# Patient Record
Sex: Female | Born: 1975 | Race: White | Hispanic: No | Marital: Married | Smoking: Former smoker
Health system: Southern US, Community
[De-identification: ages and names within clinical notes are randomized; demographics above are authoritative.]

## PROBLEM LIST (undated history)

## (undated) DIAGNOSIS — R569 Unspecified convulsions: Secondary | ICD-10-CM

## (undated) DIAGNOSIS — I10 Essential (primary) hypertension: Secondary | ICD-10-CM

## (undated) DIAGNOSIS — R002 Palpitations: Secondary | ICD-10-CM

## (undated) DIAGNOSIS — F4310 Post-traumatic stress disorder, unspecified: Secondary | ICD-10-CM

## (undated) DIAGNOSIS — F329 Major depressive disorder, single episode, unspecified: Secondary | ICD-10-CM

## (undated) DIAGNOSIS — F32A Depression, unspecified: Secondary | ICD-10-CM

## (undated) HISTORY — PX: INDUCED ABORTION: SHX677

## (undated) HISTORY — PX: FACIAL COSMETIC SURGERY: SHX629

## (undated) HISTORY — PX: DILATION AND CURETTAGE OF UTERUS: SHX78

---

## 1998-05-12 ENCOUNTER — Emergency Department (HOSPITAL_COMMUNITY): Admission: EM | Admit: 1998-05-12 | Discharge: 1998-05-12 | Payer: Self-pay | Admitting: Emergency Medicine

## 1999-02-05 ENCOUNTER — Inpatient Hospital Stay (HOSPITAL_COMMUNITY): Admission: AD | Admit: 1999-02-05 | Discharge: 1999-02-05 | Payer: Self-pay | Admitting: Family Medicine

## 1999-02-05 ENCOUNTER — Encounter: Payer: Self-pay | Admitting: Obstetrics

## 1999-02-12 ENCOUNTER — Inpatient Hospital Stay (HOSPITAL_COMMUNITY): Admission: RE | Admit: 1999-02-12 | Discharge: 1999-02-12 | Payer: Self-pay | Admitting: Family Medicine

## 1999-02-14 ENCOUNTER — Ambulatory Visit (HOSPITAL_COMMUNITY): Admission: RE | Admit: 1999-02-14 | Discharge: 1999-02-14 | Payer: Self-pay | Admitting: Obstetrics

## 2001-01-25 ENCOUNTER — Encounter: Payer: Self-pay | Admitting: Emergency Medicine

## 2001-01-25 ENCOUNTER — Emergency Department (HOSPITAL_COMMUNITY): Admission: EM | Admit: 2001-01-25 | Discharge: 2001-01-25 | Payer: Self-pay | Admitting: Emergency Medicine

## 2001-03-22 ENCOUNTER — Emergency Department (HOSPITAL_COMMUNITY): Admission: EM | Admit: 2001-03-22 | Discharge: 2001-03-22 | Payer: Self-pay | Admitting: *Deleted

## 2001-12-26 ENCOUNTER — Emergency Department (HOSPITAL_COMMUNITY): Admission: EM | Admit: 2001-12-26 | Discharge: 2001-12-26 | Payer: Self-pay | Admitting: Emergency Medicine

## 2002-12-24 ENCOUNTER — Inpatient Hospital Stay (HOSPITAL_COMMUNITY): Admission: EM | Admit: 2002-12-24 | Discharge: 2002-12-25 | Payer: Self-pay | Admitting: Emergency Medicine

## 2002-12-24 ENCOUNTER — Encounter: Payer: Self-pay | Admitting: Emergency Medicine

## 2003-08-14 ENCOUNTER — Emergency Department (HOSPITAL_COMMUNITY): Admission: EM | Admit: 2003-08-14 | Discharge: 2003-08-15 | Payer: Self-pay | Admitting: Emergency Medicine

## 2003-09-16 ENCOUNTER — Emergency Department (HOSPITAL_COMMUNITY): Admission: EM | Admit: 2003-09-16 | Discharge: 2003-09-16 | Payer: Self-pay

## 2004-06-13 ENCOUNTER — Emergency Department (HOSPITAL_COMMUNITY): Admission: EM | Admit: 2004-06-13 | Discharge: 2004-06-14 | Payer: Self-pay | Admitting: Emergency Medicine

## 2004-12-19 ENCOUNTER — Emergency Department (HOSPITAL_COMMUNITY): Admission: EM | Admit: 2004-12-19 | Discharge: 2004-12-19 | Payer: Self-pay | Admitting: Emergency Medicine

## 2004-12-21 ENCOUNTER — Emergency Department (HOSPITAL_COMMUNITY): Admission: EM | Admit: 2004-12-21 | Discharge: 2004-12-22 | Payer: Self-pay | Admitting: Emergency Medicine

## 2006-01-16 ENCOUNTER — Emergency Department (HOSPITAL_COMMUNITY): Admission: EM | Admit: 2006-01-16 | Discharge: 2006-01-16 | Payer: Self-pay | Admitting: Emergency Medicine

## 2007-05-18 ENCOUNTER — Other Ambulatory Visit: Admission: RE | Admit: 2007-05-18 | Discharge: 2007-05-18 | Payer: Self-pay | Admitting: Family Medicine

## 2008-09-05 ENCOUNTER — Other Ambulatory Visit: Admission: RE | Admit: 2008-09-05 | Discharge: 2008-09-05 | Payer: Self-pay | Admitting: Family Medicine

## 2009-01-19 ENCOUNTER — Emergency Department (HOSPITAL_COMMUNITY): Admission: EM | Admit: 2009-01-19 | Discharge: 2009-01-19 | Payer: Self-pay | Admitting: *Deleted

## 2009-01-22 ENCOUNTER — Emergency Department (HOSPITAL_COMMUNITY): Admission: EM | Admit: 2009-01-22 | Discharge: 2009-01-22 | Payer: Self-pay | Admitting: Emergency Medicine

## 2009-02-05 ENCOUNTER — Emergency Department (HOSPITAL_COMMUNITY): Admission: EM | Admit: 2009-02-05 | Discharge: 2009-02-05 | Payer: Self-pay | Admitting: *Deleted

## 2010-10-06 ENCOUNTER — Emergency Department (HOSPITAL_COMMUNITY)
Admission: EM | Admit: 2010-10-06 | Discharge: 2010-10-06 | Payer: Self-pay | Source: Home / Self Care | Admitting: Family Medicine

## 2011-01-08 LAB — CBC
HCT: 41 % (ref 36.0–46.0)
Hemoglobin: 14.4 g/dL (ref 12.0–15.0)
MCV: 94.8 fL (ref 78.0–100.0)
RBC: 4.33 MIL/uL (ref 3.87–5.11)
RDW: 14.5 % (ref 11.5–15.5)
WBC: 8.6 10*3/uL (ref 4.0–10.5)

## 2011-01-08 LAB — POCT I-STAT, CHEM 8
Calcium, Ion: 1.09 mmol/L — ABNORMAL LOW (ref 1.12–1.32)
Glucose, Bld: 122 mg/dL — ABNORMAL HIGH (ref 70–99)
Hemoglobin: 15 g/dL (ref 12.0–15.0)
Potassium: 3 mEq/L — ABNORMAL LOW (ref 3.5–5.1)
TCO2: 24 mmol/L (ref 0–100)

## 2011-01-08 LAB — DIFFERENTIAL
Basophils Absolute: 0 10*3/uL (ref 0.0–0.1)
Lymphocytes Relative: 30 % (ref 12–46)
Lymphs Abs: 2.6 10*3/uL (ref 0.7–4.0)
Monocytes Absolute: 0.9 10*3/uL (ref 0.1–1.0)
Monocytes Relative: 10 % (ref 3–12)

## 2011-01-08 LAB — POCT PREGNANCY, URINE: Preg Test, Ur: NEGATIVE

## 2011-01-08 LAB — URINALYSIS, ROUTINE W REFLEX MICROSCOPIC
Glucose, UA: NEGATIVE mg/dL
Specific Gravity, Urine: 1.004 — ABNORMAL LOW (ref 1.005–1.030)
Urobilinogen, UA: 0.2 mg/dL (ref 0.0–1.0)

## 2011-01-08 LAB — URINE CULTURE: Colony Count: NO GROWTH

## 2011-01-08 LAB — RAPID URINE DRUG SCREEN, HOSP PERFORMED
Amphetamines: NOT DETECTED
Cocaine: NOT DETECTED
Opiates: NOT DETECTED

## 2011-01-08 LAB — SALICYLATE LEVEL: Salicylate Lvl: <4 mg/dL (ref 2.8–20.0)

## 2011-01-08 LAB — BASIC METABOLIC PANEL
CO2: 23 mEq/L (ref 19–32)
Creatinine, Ser: 0.5 mg/dL (ref 0.4–1.2)
GFR calc non Af Amer: >60 mL/min (ref >60)

## 2011-02-01 ENCOUNTER — Other Ambulatory Visit (HOSPITAL_COMMUNITY)
Admission: RE | Admit: 2011-02-01 | Discharge: 2011-02-01 | Disposition: A | Discharge status: Home / Self Care | Payer: Self-pay | Source: Ambulatory Visit | Attending: Family Medicine | Admitting: Family Medicine

## 2011-02-01 DIAGNOSIS — Z124 Encounter for screening for malignant neoplasm of cervix: Secondary | ICD-10-CM | POA: Insufficient documentation

## 2011-02-15 NOTE — H&P (Signed)
NAME:  Brooke Avila, Brooke Avila                      ACCOUNT NO.:  [id number]   MEDICAL RECORD NO.:  [id number]                   PATIENT TYPE:  INP   LOCATION:  1823                                 FACILITY:  [facility]   PHYSICIAN:  Melvyn Novas, M.D.               DATE OF BIRTH:  [birth year]   DATE OF ADMISSION:  12/24/2002  DATE OF DISCHARGE:                                HISTORY & PHYSICAL   HISTORY OF PRESENT ILLNESS:  The patient is a 35 year old, right-handed,  Caucasian female with a history of three seizures, according to her husband.  Both occurred during the daytime.  He now also states that she has seizure-  like activity overnight over the last month or so.  Today, acute onset of  nausea, then confusion, and then a generalized tonoclonic seizure.  The  patient came to the ER, still convulsing and combative with diaphoresis.  She recovered after more than 30 minutes of actively fighting and needing to  be put in restraints and sedated.  She was very pale and tachycardic.  Her  pulse rate was in the 130s.  Her O2 by pulse oximetry was difficult to  measure, but around 90.  Here after 7 mg of Ativan, she recovered enough to  tolerate a CT scan in which she woke up and needed another 2 mg of Ativan.  She has slept since.  Meanwhile, we obtained a history from the husband and  later from the mother of the patient.  They said that her first seizure  occurred at the first birthday of her son, who is now 76.  She was  afterwards followed by Dr. Jeannetta Nap and was prescribed phenobarbital that she  took only at night.  A second seizure happened at a party in 2000 and a  third seizure just happened last month.  Since then, she had nocturnal  __________ that might have been seizures as well.  Today, she had a tonic  clonic seizure that seemed to be primarily generalized.   PAST MEDICAL HISTORY:  Otherwise negative, except for the history of pain  medication abuse.  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   SOCIAL HISTORY:  The patient is married.  Full time working in a Social worker.  She has one child now 66 years old.  She is a smoker and  occasional drinker.  Her drug test here is positive for marijuana.   FAMILY HISTORY:  The patient became a mother at 22.  She was born to a 75-  year-old mother.  She stated there is no epilepsy in the family, but  dyslexia.   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  This is a definite fetal origin  abnormality and not an acquired or post traumatic change of her brain  anatomy.  In addition, the patient had multiple tests here.  The Chem-7 and  CBC were normal.  A drug test was positive.  A pregnancy test was positive  to the great surprise of her husband and her mother.   PHYSICAL EXAMINATION:  VITAL SIGNS:  Heart rate 120, temperature 99 degrees,  blood pressure 180/79, respiratory rate 22.  LUNGS:  Clear to auscultation.  CORONARY:  Regular rate and rhythm.  No murmur.  ABDOMEN:  Soft and nontender.  EXTREMITIES:  No peripheral edema.  Multiple peripheral bruises due to the  seizure activity.  NEUROLOGIC:  Cranial nerves:  Pupils react equally to light and  accommodation.  Extraocular movements seem slowed, but conjugate.  The  patient is able to open her eyes briefly, but again falls asleep.  Facial  symmetry and facial sensory seem to be symmetrically preserved the patient  was not showing any signs of tongue bite.  The tongue and uvula seemed to be  midline.  Motor exam showed equal strength bilaterally.  Deep tendon  reflexes are 1+ with downgoing toes to plantar stimulation.  Sensory:  The  patient is cooperating enough to do this testing.  She is not cooperative to  do a coordination test.   ASSESSMENT:  1. 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  2. Drug use history, which might have lowered seizure threshold.  3. Pregnancy, which might have also lower seizure threshold as seizures can     be exacerbated by progesterone.   PLAN:  1. Phenobarbital was to be started as the patient can afford the drug and     tolerated the drug in the past.  2. Outpatient EEG is recommended.  3. Phenobarbital levels should be drawn weekly for the first trimester so     that the patient is not a risk of intoxicating herself.  4. She will receive Ativan p.r.n.  5. She will receive a prescription for rectal Valium for home use.  Her     husband will be learning to use the medication.  6. If she needs counseling regarding her drug use, we will provide.  7. We will probably discharge her after observation when she is no longer     postictal.   ICD CODE:  Seizure disorder, complex partial seizures, not likely to be easy  to treat as the patient has an underlying morphologic abnormality.  345.11                                               Melvyn Novas, M.D.    CD/MEDQ  D:  12/24/2002  T:  12/25/2002  Job:  [id number]

## 2014-01-06 ENCOUNTER — Emergency Department (HOSPITAL_BASED_OUTPATIENT_CLINIC_OR_DEPARTMENT_OTHER)
Admission: EM | Admit: 2014-01-06 | Discharge: 2014-01-06 | Disposition: A | Discharge status: Home / Self Care | Payer: Self-pay | Attending: Emergency Medicine | Admitting: Emergency Medicine

## 2014-01-06 ENCOUNTER — Encounter (HOSPITAL_BASED_OUTPATIENT_CLINIC_OR_DEPARTMENT_OTHER): Payer: Self-pay | Admitting: Emergency Medicine

## 2014-01-06 ENCOUNTER — Emergency Department (HOSPITAL_BASED_OUTPATIENT_CLINIC_OR_DEPARTMENT_OTHER): Payer: Self-pay

## 2014-01-06 DIAGNOSIS — R072 Precordial pain: Secondary | ICD-10-CM | POA: Insufficient documentation

## 2014-01-06 DIAGNOSIS — R1013 Epigastric pain: Secondary | ICD-10-CM | POA: Insufficient documentation

## 2014-01-06 DIAGNOSIS — F329 Major depressive disorder, single episode, unspecified: Secondary | ICD-10-CM | POA: Insufficient documentation

## 2014-01-06 DIAGNOSIS — F3289 Other specified depressive episodes: Secondary | ICD-10-CM | POA: Insufficient documentation

## 2014-01-06 DIAGNOSIS — I10 Essential (primary) hypertension: Secondary | ICD-10-CM | POA: Insufficient documentation

## 2014-01-06 DIAGNOSIS — R0602 Shortness of breath: Secondary | ICD-10-CM | POA: Insufficient documentation

## 2014-01-06 DIAGNOSIS — R079 Chest pain, unspecified: Secondary | ICD-10-CM

## 2014-01-06 DIAGNOSIS — F17200 Nicotine dependence, unspecified, uncomplicated: Secondary | ICD-10-CM | POA: Insufficient documentation

## 2014-01-06 DIAGNOSIS — G40909 Epilepsy, unspecified, not intractable, without status epilepticus: Secondary | ICD-10-CM | POA: Insufficient documentation

## 2014-01-06 HISTORY — DX: Major depressive disorder, single episode, unspecified: F32.9

## 2014-01-06 HISTORY — DX: Unspecified convulsions: R56.9

## 2014-01-06 HISTORY — DX: Essential (primary) hypertension: I10

## 2014-01-06 HISTORY — DX: Depression, unspecified: F32.A

## 2014-01-06 LAB — CBC
HCT: 40.1 % (ref 36.0–46.0)
Hemoglobin: 13.8 g/dL (ref 12.0–15.0)
MCH: 32.2 pg (ref 26.0–34.0)
MCHC: 34.4 g/dL (ref 30.0–36.0)
MCV: 93.7 fL (ref 78.0–100.0)
Platelets: 352 10*3/uL (ref 150–400)
RBC: 4.28 MIL/uL (ref 3.87–5.11)
RDW: 13.8 % (ref 11.5–15.5)
WBC: 12.1 10*3/uL — ABNORMAL HIGH (ref 4.0–10.5)

## 2014-01-06 LAB — BASIC METABOLIC PANEL
BUN: 19 mg/dL (ref 6–23)
CALCIUM: 9.7 mg/dL (ref 8.4–10.5)
CO2: 24 meq/L (ref 19–32)
CREATININE: 0.7 mg/dL (ref 0.50–1.10)
Chloride: 105 mEq/L (ref 96–112)
GFR calc Af Amer: >90 mL/min (ref >90)
Glucose, Bld: 100 mg/dL — ABNORMAL HIGH (ref 70–99)
Potassium: 3.9 mEq/L (ref 3.7–5.3)
SODIUM: 142 meq/L (ref 137–147)

## 2014-01-06 LAB — TROPONIN I: Troponin I: <0.3 ng/mL (ref <0.30)

## 2014-01-06 NOTE — ED Notes (Signed)
Patient asked to change into a gown.  

## 2014-01-06 NOTE — ED Notes (Signed)
Stabbing chest pain for a month. She was seen by her MD 2 weeks ago and was started on Metoprolol. She called her MD today and was told to come to the office on Tuesday if she still had the pain.

## 2014-01-06 NOTE — Discharge Instructions (Signed)
Chest Pain (Nonspecific) °It is often hard to give a specific diagnosis for the cause of chest pain. There is always a chance that your pain could be related to something serious, such as a heart attack or a blood clot in the lungs. You need to follow up with your caregiver for further evaluation. °CAUSES  °· Heartburn. °· Pneumonia or bronchitis. °· Anxiety or stress. °· Inflammation around your heart (pericarditis) or lung (pleuritis or pleurisy). °· A blood clot in the lung. °· A collapsed lung (pneumothorax). It can develop suddenly on its own (spontaneous pneumothorax) or from injury (trauma) to the chest. °· Shingles infection (herpes zoster virus). °The chest wall is composed of bones, muscles, and cartilage. Any of these can be the source of the pain. °· The bones can be bruised by injury. °· The muscles or cartilage can be strained by coughing or overwork. °· The cartilage can be affected by inflammation and become sore (costochondritis). °DIAGNOSIS  °Lab tests or other studies, such as X-rays, electrocardiography, stress testing, or cardiac imaging, may be needed to find the cause of your pain.  °TREATMENT  °· Treatment depends on what may be causing your chest pain. Treatment may include: °· Acid blockers for heartburn. °· Anti-inflammatory medicine. °· Pain medicine for inflammatory conditions. °· Antibiotics if an infection is present. °· You may be advised to change lifestyle habits. This includes stopping smoking and avoiding alcohol, caffeine, and chocolate. °· You may be advised to keep your head raised (elevated) when sleeping. This reduces the chance of acid going backward from your stomach into your esophagus. °· Most of the time, nonspecific chest pain will improve within 2 to 3 days with rest and mild pain medicine. °HOME CARE INSTRUCTIONS  °· If antibiotics were prescribed, take your antibiotics as directed. Finish them even if you start to feel better. °· For the next few days, avoid physical  activities that bring on chest pain. Continue physical activities as directed. °· Do not smoke. °· Avoid drinking alcohol. °· Only take over-the-counter or prescription medicine for pain, discomfort, or fever as directed by your caregiver. °· Follow your caregiver's suggestions for further testing if your chest pain does not go away. °· Keep any follow-up appointments you made. If you do not go to an appointment, you could develop lasting (chronic) problems with pain. If there is any problem keeping an appointment, you must call to reschedule. °SEEK MEDICAL CARE IF:  °· You think you are having problems from the medicine you are taking. Read your medicine instructions carefully. °· Your chest pain does not go away, even after treatment. °· You develop a rash with blisters on your chest. °SEEK IMMEDIATE MEDICAL CARE IF:  °· You have increased chest pain or pain that spreads to your arm, neck, jaw, back, or abdomen. °· You develop shortness of breath, an increasing cough, or you are coughing up blood. °· You have severe back or abdominal pain, feel nauseous, or vomit. °· You develop severe weakness, fainting, or chills. °· You have a fever. °THIS IS AN EMERGENCY. Do not wait to see if the pain will go away. Get medical help at once. Call your local emergency services (911 in [place] Do not drive yourself to the hospital. °MAKE SURE YOU:  °· Understand these instructions. °· Will watch your condition. °· Will get help right away if you are not doing well or get worse. °Document Released: 06/26/2005 Document Revised: 12/09/2011 Document Reviewed: 04/21/2008 °[employer]® Patient Information ©2014 [employer]   [employer]. ° °

## 2014-01-06 NOTE — ED Notes (Signed)
Pt. Reports she quit smoking 3 wks ago and is 2 yrs clean from all kinds of drugs.

## 2014-01-06 NOTE — ED Provider Notes (Signed)
CSN: [id number]     Arrival date & time 01/06/14  1558 History   First MD Initiated Contact with Patient 01/06/14 1813    This chart was scribed for Brooke HaitWilliam Bryelle Spiewak, MD by Marica OtterNusrat Rahman, ED Scribe. This patient was seen in room MH02/MH02 and the patient's care was started at 6:32 PM.  PCP: Brooke LabellaMILLER,Brooke LYNN, MD  Chief Complaint  Patient presents with  . Chest Pain   Patient is a 38 y.o. female presenting with chest pain. The history is provided by the patient. No language interpreter was used.  Chest Pain Pain location:  Substernal area Pain quality: dull and sharp   Pain radiates to:  L shoulder Pain radiates to the back: no   Pain severity:  Moderate Onset quality:  Sudden Duration:  2 months Timing:  Intermittent Relieved by:  Nothing Associated symptoms: shortness of breath   Associated symptoms: no nausea and not vomiting   Risk factors: hypertension and smoking   Risk factors: no birth control    HPI Comments: Brooke Eda KeysM Arcilla is a 38 y.o. female who presents to the Emergency Department, with a  Hx of HTN, drug abuse (clean for 2 yrs), and seizures, complaining of intermittent chest pain with a associated SOB onset one month ago. Pt describes the pain as a sharp, dull pain. Pt reports the chest pain radiates to the right shoulder. Pt reports episodes are brought on without instigation. However, the pain is aggravated by exertion, including speaking loudly.Further, pt states that intermittently she has multiple episodes of chest pain per day, for instance, today she has been having episodes all day. Pt reports she was seen by her PCP a couple of weeks ago for the same and her PCP prescribed meds for her chest pain; pt reports she has been compliant with her meds without relief. Pt denies nausea, vomiting or swelling of the lower extremities. Pt denies long distance travel. Pt is an everyday smoker (smokes approx 5 cigarettes per day).   Past Medical History  Diagnosis Date  .  Hypertension   . Seizures   . Depression    History reviewed. No pertinent past surgical history. No family history on file. History  Substance Use Topics  . Smoking status: Current Every Day Smoker -- 0.50 packs/day    Types: Cigarettes  . Smokeless tobacco: Not on file  . Alcohol Use: No   OB History   Grav Para Term Preterm Abortions TAB SAB Ect Mult Living                 Review of Systems  Respiratory: Positive for shortness of breath.   Cardiovascular: Positive for chest pain.  Gastrointestinal: Negative for nausea and vomiting.  All other systems reviewed and are negative.     Allergies  Dilantin  Home Medications   Current Outpatient Rx  Name  Route  Sig  Dispense  Refill  . LITHIUM PO   Oral   Take by mouth.         . METOPROLOL TARTRATE PO   Oral   Take by mouth.         Marland Kitchen. PHENOBARBITAL PO   Oral   Take by mouth.         . Sertraline HCl (ZOLOFT PO)   Oral   Take by mouth.          Triage Vitals: BP 124/84  Pulse 92  Temp(Src) 98.6 F (37 C) (Oral)  Resp 20  Ht 5\' 4"  (  1.626 m)  Wt 166 lb (75.297 kg)  BMI 28.48 kg/m2  SpO2 98%  LMP 12/16/2013 Physical Exam  Nursing note and vitals reviewed. Constitutional: She is oriented to person, place, and time. She appears well-developed and well-nourished. No distress.  HENT:  Head: Normocephalic and atraumatic.  Eyes: EOM are normal.  Neck: Neck supple. No tracheal deviation present.  Cardiovascular: Normal rate.   Pulmonary/Chest: Effort normal and breath sounds normal. No respiratory distress. She exhibits tenderness (mild, left center ).  Abdominal: Soft. She exhibits no distension. There is tenderness (mild epigastric tenderness).  Musculoskeletal: Normal range of motion.  Neurological: She is alert and oriented to person, place, and time.  Skin: Skin is warm and dry.  Psychiatric: She has a normal mood and affect. Her behavior is normal.    ED Course  Procedures (including  critical care time) DIAGNOSTIC STUDIES: Oxygen Saturation is 98% on RA, adequate by my interpretation.    COORDINATION OF CARE:  6:40 PM-Discussed treatment plan which includes EKG, CXR, and labs with pt at bedside and pt agreed to plan.   Labs Review Labs Reviewed - No data to display Imaging Review No results found.   EKG Interpretation   Date/Time:  Thursday January 06 2014 16:13:19 EDT Ventricular Rate:  94 PR Interval:  138 QRS Duration: 84 QT Interval:  330 QTC Calculation: 412 R Axis:   57 Text Interpretation:  Biatrial enlargement Normal sinus rhythm Nonspecific  T wave abnormality Abnormal ECG Similar to prior Confirmed by Gwendolyn Grant  MD,  Broadus Costilla ([id number]) on 01/06/2014 4:41:49 PM      MDM   Final diagnoses:  Chest pain    38 year old female here with atypical chest pain. Episodes of 1 minute, stabbing without radiation the left chest. Seen by her in the 2 weeks ago, started on metoprolol for hypertension. Instructed to come to the ED today by her primary care doctor. Here vitals stable. Patient relaxing comfortably. No chest pain this time. Mild left anterior chest wall pain on palpation. Clinical history not concerning for ACS or PE. Likely musculoskeletal, labs normal. Chest x-ray normal EKG normal. Stable for discharge. Instructed to followup with primary care physician.  I personally performed the services described in this documentation, which was scribed in my presence. The recorded information has been reviewed and is accurate.      Brooke Hait, MD 01/06/14 [phone]

## 2014-01-25 ENCOUNTER — Ambulatory Visit: Payer: Self-pay | Admitting: Cardiovascular Disease

## 2015-04-01 ENCOUNTER — Emergency Department (HOSPITAL_BASED_OUTPATIENT_CLINIC_OR_DEPARTMENT_OTHER): Payer: Self-pay

## 2015-04-01 ENCOUNTER — Emergency Department (HOSPITAL_BASED_OUTPATIENT_CLINIC_OR_DEPARTMENT_OTHER)
Admission: EM | Admit: 2015-04-01 | Discharge: 2015-04-01 | Disposition: A | Discharge status: Home / Self Care | Payer: Self-pay | Attending: Emergency Medicine | Admitting: Emergency Medicine

## 2015-04-01 ENCOUNTER — Encounter (HOSPITAL_BASED_OUTPATIENT_CLINIC_OR_DEPARTMENT_OTHER): Payer: Self-pay | Admitting: Emergency Medicine

## 2015-04-01 DIAGNOSIS — Z87891 Personal history of nicotine dependence: Secondary | ICD-10-CM | POA: Insufficient documentation

## 2015-04-01 DIAGNOSIS — Y9389 Activity, other specified: Secondary | ICD-10-CM | POA: Insufficient documentation

## 2015-04-01 DIAGNOSIS — S0033XA Contusion of nose, initial encounter: Secondary | ICD-10-CM | POA: Insufficient documentation

## 2015-04-01 DIAGNOSIS — Z79899 Other long term (current) drug therapy: Secondary | ICD-10-CM | POA: Insufficient documentation

## 2015-04-01 DIAGNOSIS — T07XXXA Unspecified multiple injuries, initial encounter: Secondary | ICD-10-CM

## 2015-04-01 DIAGNOSIS — S8002XA Contusion of left knee, initial encounter: Secondary | ICD-10-CM | POA: Insufficient documentation

## 2015-04-01 DIAGNOSIS — Y9289 Other specified places as the place of occurrence of the external cause: Secondary | ICD-10-CM | POA: Insufficient documentation

## 2015-04-01 DIAGNOSIS — F329 Major depressive disorder, single episode, unspecified: Secondary | ICD-10-CM | POA: Insufficient documentation

## 2015-04-01 DIAGNOSIS — Y998 Other external cause status: Secondary | ICD-10-CM | POA: Insufficient documentation

## 2015-04-01 DIAGNOSIS — F4310 Post-traumatic stress disorder, unspecified: Secondary | ICD-10-CM | POA: Insufficient documentation

## 2015-04-01 DIAGNOSIS — S5012XA Contusion of left forearm, initial encounter: Secondary | ICD-10-CM | POA: Insufficient documentation

## 2015-04-01 DIAGNOSIS — S50812A Abrasion of left forearm, initial encounter: Secondary | ICD-10-CM | POA: Insufficient documentation

## 2015-04-01 DIAGNOSIS — S8001XA Contusion of right knee, initial encounter: Secondary | ICD-10-CM | POA: Insufficient documentation

## 2015-04-01 DIAGNOSIS — I10 Essential (primary) hypertension: Secondary | ICD-10-CM | POA: Insufficient documentation

## 2015-04-01 HISTORY — DX: Post-traumatic stress disorder, unspecified: F43.10

## 2015-04-01 HISTORY — DX: Palpitations: R00.2
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
## 2015-07-18 ENCOUNTER — Ambulatory Visit
Admission: RE | Admit: 2015-07-18 | Discharge: 2015-07-18 | Disposition: A | Discharge status: Home / Self Care | Payer: Self-pay | Source: Ambulatory Visit | Attending: Family Medicine | Admitting: Family Medicine

## 2015-07-18 ENCOUNTER — Other Ambulatory Visit: Payer: Self-pay | Admitting: Family Medicine

## 2015-07-18 DIAGNOSIS — M79602 Pain in left arm: Secondary | ICD-10-CM

## 2015-07-20 ENCOUNTER — Emergency Department (HOSPITAL_BASED_OUTPATIENT_CLINIC_OR_DEPARTMENT_OTHER)
Admission: EM | Admit: 2015-07-20 | Discharge: 2015-07-20 | Disposition: A | Discharge status: Home / Self Care | Payer: Self-pay | Attending: Emergency Medicine | Admitting: Emergency Medicine

## 2015-07-20 ENCOUNTER — Emergency Department (HOSPITAL_BASED_OUTPATIENT_CLINIC_OR_DEPARTMENT_OTHER): Payer: Self-pay

## 2015-07-20 ENCOUNTER — Encounter (HOSPITAL_BASED_OUTPATIENT_CLINIC_OR_DEPARTMENT_OTHER): Payer: Self-pay | Admitting: *Deleted

## 2015-07-20 DIAGNOSIS — Z87891 Personal history of nicotine dependence: Secondary | ICD-10-CM | POA: Insufficient documentation

## 2015-07-20 DIAGNOSIS — F329 Major depressive disorder, single episode, unspecified: Secondary | ICD-10-CM | POA: Insufficient documentation

## 2015-07-20 DIAGNOSIS — K8050 Calculus of bile duct without cholangitis or cholecystitis without obstruction: Secondary | ICD-10-CM

## 2015-07-20 DIAGNOSIS — K8020 Calculus of gallbladder without cholecystitis without obstruction: Secondary | ICD-10-CM | POA: Insufficient documentation

## 2015-07-20 DIAGNOSIS — I10 Essential (primary) hypertension: Secondary | ICD-10-CM | POA: Insufficient documentation

## 2015-07-20 DIAGNOSIS — Z79899 Other long term (current) drug therapy: Secondary | ICD-10-CM | POA: Insufficient documentation

## 2015-07-20 LAB — COMPREHENSIVE METABOLIC PANEL
ALBUMIN: 3.9 g/dL (ref 3.5–5.0)
ALK PHOS: 48 U/L (ref 38–126)
ALT: 29 U/L (ref 14–54)
AST: 30 U/L (ref 15–41)
Anion gap: 6 (ref 5–15)
BUN: 16 mg/dL (ref 6–20)
CALCIUM: 8.8 mg/dL — AB (ref 8.9–10.3)
CHLORIDE: 106 mmol/L (ref 101–111)
CO2: 22 mmol/L (ref 22–32)
CREATININE: 0.49 mg/dL (ref 0.44–1.00)
GFR calc Af Amer: >60 mL/min (ref >60)
GFR calc non Af Amer: >60 mL/min (ref >60)
Glucose, Bld: 101 mg/dL — ABNORMAL HIGH (ref 65–99)
Potassium: 3.8 mmol/L (ref 3.5–5.1)
Sodium: 134 mmol/L — ABNORMAL LOW (ref 135–145)
Total Bilirubin: 0.3 mg/dL (ref 0.3–1.2)
Total Protein: 7.2 g/dL (ref 6.5–8.1)

## 2015-07-20 LAB — CBC WITH DIFFERENTIAL/PLATELET
Basophils Absolute: 0 10*3/uL (ref 0.0–0.1)
Basophils Relative: 0 %
EOS ABS: 0 10*3/uL (ref 0.0–0.7)
EOS PCT: 0 %
HCT: 38.7 % (ref 36.0–46.0)
Hemoglobin: 13 g/dL (ref 12.0–15.0)
LYMPHS ABS: 2.2 10*3/uL (ref 0.7–4.0)
Lymphocytes Relative: 24 %
MCH: 30.4 pg (ref 26.0–34.0)
MCHC: 33.6 g/dL (ref 30.0–36.0)
MCV: 90.6 fL (ref 78.0–100.0)
Monocytes Absolute: 0.6 10*3/uL (ref 0.1–1.0)
Monocytes Relative: 7 %
Neutro Abs: 6.5 10*3/uL (ref 1.7–7.7)
Neutrophils Relative %: 69 %
PLATELETS: 350 10*3/uL (ref 150–400)
RBC: 4.27 MIL/uL (ref 3.87–5.11)
RDW: 13.3 % (ref 11.5–15.5)
WBC: 9.3 10*3/uL (ref 4.0–10.5)

## 2015-07-20 LAB — LIPASE, BLOOD: Lipase: 28 U/L (ref 11–51)

## 2015-07-20 LAB — TROPONIN I: Troponin I: <0.03 ng/mL (ref <0.031)

## 2015-07-20 MED ORDER — SODIUM CHLORIDE 0.9 % IV BOLUS (SEPSIS)
1000.0000 mL | Freq: Once | INTRAVENOUS | Status: AC
  Administered 2015-07-20: 1000 mL via INTRAVENOUS

## 2015-07-20 MED ORDER — MORPHINE SULFATE (PF) 4 MG/ML IV SOLN
4.0000 mg | Freq: Once | INTRAVENOUS | Status: AC
  Administered 2015-07-20: 4 mg via INTRAVENOUS
  Filled 2015-07-20: qty 1

## 2015-07-20 MED ORDER — ONDANSETRON HCL 4 MG/2ML IJ SOLN
4.0000 mg | Freq: Once | INTRAMUSCULAR | Status: AC
  Administered 2015-07-20: 4 mg via INTRAVENOUS
  Filled 2015-07-20: qty 2

## 2015-07-20 MED ORDER — OXYCODONE-ACETAMINOPHEN 5-325 MG PO TABS: 1.0000 | ORAL_TABLET | Freq: Four times a day (QID) | ORAL | Status: AC | PRN

## 2015-07-20 NOTE — ED Notes (Signed)
Patient transported to Ultrasound 

## 2015-07-20 NOTE — ED Provider Notes (Signed)
CSN: [id number]     Arrival date & time 07/20/15  1815 History  By signing my name below, I, Gwenyth Ober, attest that this documentation has been prepared under the direction and in the presence of Geoffery Lyons, MD.  Electronically Signed: Gwenyth Ober, ED Scribe. 07/20/2015. 6:43 PM.   Chief Complaint  Patient presents with  . Abdominal Pain   Patient is a 39 y.o. female presenting with abdominal pain. The history is provided by the patient and the spouse.  Abdominal Pain Pain location:  Epigastric Pain quality: sharp   Pain radiates to:  Does not radiate Pain severity:  Moderate Onset quality:  Sudden Duration:  1 hour Timing:  Constant Progression:  Worsening Chronicity:  Recurrent Context: not eating and not previous surgeries   Relieved by:  Nothing Worsened by:  Nothing tried Associated symptoms: no vomiting   Risk factors: has not had multiple surgeries     HPI Comments: Brooke Avila is a 39 y.o. female who presents to the Emergency Department complaining of recurrent, moderate epigastric pain that started today. She states bilateral knuckle pain that started several months ago as an associated symptom. Pt has been evaluated with her PCP and GI specialists regarding recurrent epigastric pain that initially started 4 years ago. She has been told that she has a fatty liver and a possible gall bladder issues. Pt was last seen by her PCP a few days ago who took multiple negative x-rays. She is currently taking Prednisone-20 mg TID with no relief to her pain. Pt has not eaten within the last three hours. Pt has a history of drug abuse, but last used in 2013. She denies a history of abdominal surgeries. Pt also denies vomiting.  Past Medical History  Diagnosis Date  . Hypertension   . Seizures (HCC)   . Depression   . PTSD (post-traumatic stress disorder)   . Palpitations    Past Surgical History  Procedure Laterality Date  . Dilation and curettage of uterus    .  Induced abortion    . Facial cosmetic surgery     No family history on file. Social History  Substance Use Topics  . Smoking status: Former Smoker -- 0.50 packs/day    Types: Cigarettes  . Smokeless tobacco: None  . Alcohol Use: Yes     Comment: Rarely - Had 3 beers tonight   OB History    No data available     Review of Systems  Gastrointestinal: Positive for abdominal pain. Negative for vomiting.  Musculoskeletal: Positive for arthralgias.  All other systems reviewed and are negative.  Allergies  Dilantin and Imitrex  Home Medications   Prior to Admission medications   Medication Sig Start Date End Date Taking? Authorizing Provider  busPIRone (BUSPAR) 15 MG tablet Take 15 mg by mouth 2 (two) times daily.    Historical Provider, MD  LITHIUM PO Take by mouth.    Historical Provider, MD  METOPROLOL TARTRATE PO Take by mouth.    Historical Provider, MD  PHENOBARBITAL PO Take by mouth.    Historical Provider, MD  Sertraline HCl (ZOLOFT PO) Take by mouth.    Historical Provider, MD   BP 135/99 mmHg  Pulse 74  Temp(Src) 98.4 F (36.9 C) (Oral)  Resp 24  Ht  (1.626 m)  Wt 190 lb (86.183 kg)  BMI 32.60 kg/m2  SpO2 98%  LMP 07/01/2015 Physical Exam  Constitutional: She is oriented to person, place, and time. She appears well-developed and  well-nourished. No distress.  HENT:  Head: Normocephalic and atraumatic.  Eyes: Conjunctivae and EOM are normal.  Neck: Neck supple. No tracheal deviation present.  Cardiovascular: Normal rate, regular rhythm and normal heart sounds.   Pulmonary/Chest: Effort normal and breath sounds normal. No respiratory distress. She has no wheezes.  Abdominal: Soft. There is tenderness (TTP of epigastrium).  Neurological: She is alert and oriented to person, place, and time.  Skin: Skin is warm and dry.  Psychiatric: She has a normal mood and affect. Her behavior is normal.  Nursing note and vitals reviewed.   ED Course  Procedures   DIAGNOSTIC STUDIES: Oxygen Saturation is 98% on RA, normal by my interpretation.    COORDINATION OF CARE: 6:41 PM Discussed treatment plan with pt which includes lab work, IV fluids and US abdomen. Pt agreed to plan.   Labs Review Labs Reviewed  COMPREHENSIVE METABOLIC PANEL  LIPASE, BLOOD  CBC WITH DIFFERENTIAL/PLATELET  TROPONIN I    Imaging Review No results found. I have personally reviewed and evaluated these images and lab results as part of my medical decision-making.   EKG Interpretation   Date/Time:  Thursday July 20 2015 18:26:47 EDT Ventricular Rate:  73 PR Interval:  146 QRS Duration: 84 QT Interval:  374 QTC Calculation: 412 R Axis:   66 Text Interpretation:  Normal sinus rhythm Normal ECG Confirmed by Corrie Brannen   MD, Cohen Doleman ([id number]) on 07/20/2015 6:41:39 PM      MDM   Final diagnoses:  None    Patient presents with severe epigastric pain that started approximately 30 minutes prior to arrival. She is feeling better after pain medication in the ER. Her workup reveals no white count, normal LFTs and lipase, however ultrasound does show cholelithiasis with a sonographic Murphy sign but no other signs of acute cholecystitis. She is feeling better and will be discharged to home with pain medication and follow-up with general surgery.  Roney JaffeI, Katie Moch, personally performed the services described in this documentation. All medical record entries made by the scribe were at my direction and in my presence.  I have reviewed the chart and discharge instructions and agree that the record reflects my personal performance and is accurate and complete. Geoffery LyonseLo, Suri Tafolla.  07/20/2015. 9:00 PM.       Geoffery Lyonsouglas Tavarus Poteete, MD 07/20/15 2100

## 2015-07-20 NOTE — ED Notes (Signed)
Returned from U/S, ambulatory to restroom

## 2015-07-20 NOTE — Discharge Instructions (Signed)
Percocet as prescribed as needed for pain.  Call [facility] [place] [facility] tomorrow to schedule a follow-up appointment to discuss your gallstones. Their contact information for them has been provided in this discharge summary.  Return to the ER if you develop worsening pain not relieved with Percocet, high fever, bloody stool, or other new and concerning symptoms.   Biliary Colic Biliary colic is a pain in the upper abdomen. The pain:  Is usually felt on the right side of the abdomen, but it may also be felt in the center of the abdomen, just below the breastbone (sternum).  May spread back toward the right shoulder blade.  May be steady or irregular.  May be accompanied by nausea and vomiting. Most of the time, the pain goes away in 1-5 hours. After the most intense pain passes, the abdomen may continue to ache mildly for about 24 hours. Biliary colic is caused by a blockage in the bile duct. The bile duct is a pathway that carries bile--a liquid that helps to digest fats--from the gallbladder to the small intestine. Biliary colic usually occurs after eating, when the digestive system demands bile. The pain develops when muscle cells contract forcefully to try to move the blockage so that bile can get by. HOME CARE INSTRUCTIONS  Take medicines only as directed by your health care provider.  Drink enough fluid to keep your urine clear or pale yellow.  Avoid fatty, greasy, and fried foods. These kinds of foods increase your body's demand for bile.  Avoid any foods that make your pain worse.  Avoid overeating.  Avoid having a large meal after fasting. SEEK MEDICAL CARE IF:  You develop a fever.  Your pain gets worse.  You vomit.  You develop nausea that prevents you from eating and drinking. SEEK IMMEDIATE MEDICAL CARE IF:  You suddenly develop a fever and shaking chills.  You develop a yellowish discoloration (jaundice) of:  Skin.  Whites of the eyes.  Mucous  membranes.  You have continuous or severe pain that is not relieved with medicines.  You have nausea and vomiting that is not relieved with medicines.  You develop dizziness or you faint.   This information is not intended to replace advice given to you by your health care provider. Make sure you discuss any questions you have with your health care provider.   Document Released: 02/17/2006 Document Revised: 01/31/2015 Document Reviewed: 06/28/2014 Elsevier Interactive Patient Education 2016 [employer]  Cholelithiasis Cholelithiasis (also called gallstones) is a form of gallbladder disease in which gallstones form in your gallbladder. The gallbladder is an organ that stores bile made in the liver, which helps digest fats. Gallstones begin as small crystals and slowly grow into stones. Gallstone pain occurs when the gallbladder spasms and a gallstone is blocking the duct. Pain can also occur when a stone passes out of the duct.  RISK FACTORS  Being female.   Having multiple pregnancies. Health care providers sometimes advise removing diseased gallbladders before future pregnancies.   Being obese.  Eating a diet heavy in fried foods and fat.   Being older than 60 years and increasing age.   Prolonged use of medicines containing female hormones.   Having diabetes mellitus.   Rapidly losing weight.   Having a family history of gallstones (heredity).  SYMPTOMS  Nausea.   Vomiting.  Abdominal pain.   Yellowing of the skin (jaundice).   Sudden pain. It may persist from several minutes to several hours.  Fever.  Tenderness to the touch. In some cases, when gallstones do not move into the bile duct, people have no pain or symptoms. These are called "silent" gallstones.  TREATMENT Silent gallstones do not need treatment. In severe cases, emergency surgery may be required. Options for treatment include:  Surgery to remove the gallbladder. This is the most  common treatment.  Medicines. These do not always work and may take 6-12 months or more to work.  Shock wave treatment (extracorporeal biliary lithotripsy). In this treatment an ultrasound machine sends shock waves to the gallbladder to break gallstones into smaller pieces that can pass into the intestines or be dissolved by medicine. HOME CARE INSTRUCTIONS   Only take over-the-counter or prescription medicines for pain, discomfort, or fever as directed by your health care provider.   Follow a low-fat diet until seen again by your health care provider. Fat causes the gallbladder to contract, which can result in pain.   Follow up with your health care provider as directed. Attacks are almost always recurrent and surgery is usually required for permanent treatment.  SEEK IMMEDIATE MEDICAL CARE IF:   Your pain increases and is not controlled by medicines.   You have a fever or persistent symptoms for more than 2-3 days.   You have a fever and your symptoms suddenly get worse.   You have persistent nausea and vomiting.  MAKE SURE YOU:   Understand these instructions.  Will watch your condition.  Will get help right away if you are not doing well or get worse.   This information is not intended to replace advice given to you by your health care provider. Make sure you discuss any questions you have with your health care provider.   Document Released: 09/12/2005 Document Revised: 05/19/2013 Document Reviewed: 03/10/2013 Elsevier Interactive Patient Education [employer]

## 2015-07-20 NOTE — ED Notes (Signed)
MD back at bedside.

## 2015-07-20 NOTE — ED Notes (Signed)
Epigastric pain x 30 minutes. Hx of same in the past. She has not taken antiacids in years.

## 2015-07-21 NOTE — ED Notes (Signed)
Received call from patient requesting to have the physician call [facility] and get her an appointment earlier than two weeks.   Reviewed chart with Dr. Radford PaxBeaton, and placed call to [facility].  Spoke with Irving BurtonEmily, who will attempt to find an earlier appointment and call the patient back.  Reviewed signs and symptoms that would require patient to go back to the ED, fever, worsening pain, nausea and vomiting or other concerns.

## 2015-07-23 ENCOUNTER — Inpatient Hospital Stay (HOSPITAL_COMMUNITY)
Admission: EM | Admit: 2015-07-23 | Discharge: 2015-07-26 | DRG: 418 | Disposition: A | Discharge status: Home / Self Care | Payer: Self-pay | Attending: Internal Medicine | Admitting: Internal Medicine

## 2015-07-23 ENCOUNTER — Emergency Department (HOSPITAL_COMMUNITY): Payer: Self-pay

## 2015-07-23 ENCOUNTER — Encounter (HOSPITAL_COMMUNITY): Payer: Self-pay | Admitting: Emergency Medicine

## 2015-07-23 DIAGNOSIS — Z79899 Other long term (current) drug therapy: Secondary | ICD-10-CM

## 2015-07-23 DIAGNOSIS — B9620 Unspecified Escherichia coli [E. coli] as the cause of diseases classified elsewhere: Secondary | ICD-10-CM | POA: Diagnosis present

## 2015-07-23 DIAGNOSIS — K8010 Calculus of gallbladder with chronic cholecystitis without obstruction: Principal | ICD-10-CM | POA: Diagnosis present

## 2015-07-23 DIAGNOSIS — R569 Unspecified convulsions: Secondary | ICD-10-CM

## 2015-07-23 DIAGNOSIS — N390 Urinary tract infection, site not specified: Secondary | ICD-10-CM | POA: Diagnosis present

## 2015-07-23 DIAGNOSIS — F329 Major depressive disorder, single episode, unspecified: Secondary | ICD-10-CM | POA: Diagnosis present

## 2015-07-23 DIAGNOSIS — R109 Unspecified abdominal pain: Secondary | ICD-10-CM

## 2015-07-23 DIAGNOSIS — R1011 Right upper quadrant pain: Secondary | ICD-10-CM

## 2015-07-23 DIAGNOSIS — Z87891 Personal history of nicotine dependence: Secondary | ICD-10-CM

## 2015-07-23 DIAGNOSIS — M659 Synovitis and tenosynovitis, unspecified: Secondary | ICD-10-CM | POA: Diagnosis present

## 2015-07-23 DIAGNOSIS — R945 Abnormal results of liver function studies: Secondary | ICD-10-CM

## 2015-07-23 DIAGNOSIS — F4310 Post-traumatic stress disorder, unspecified: Secondary | ICD-10-CM | POA: Diagnosis present

## 2015-07-23 DIAGNOSIS — R7989 Other specified abnormal findings of blood chemistry: Secondary | ICD-10-CM

## 2015-07-23 DIAGNOSIS — I10 Essential (primary) hypertension: Secondary | ICD-10-CM

## 2015-07-23 DIAGNOSIS — K8020 Calculus of gallbladder without cholecystitis without obstruction: Secondary | ICD-10-CM

## 2015-07-23 DIAGNOSIS — F32A Depression, unspecified: Secondary | ICD-10-CM

## 2015-07-23 DIAGNOSIS — M79645 Pain in left finger(s): Secondary | ICD-10-CM | POA: Diagnosis present

## 2015-07-23 LAB — CBC WITH DIFFERENTIAL/PLATELET
BASOS PCT: 0 %
Basophils Absolute: 0 10*3/uL (ref 0.0–0.1)
EOS ABS: 0 10*3/uL (ref 0.0–0.7)
Eosinophils Relative: 0 %
HCT: 40.1 % (ref 36.0–46.0)
HEMOGLOBIN: 13.6 g/dL (ref 12.0–15.0)
Lymphocytes Relative: 21 %
Lymphs Abs: 2.2 10*3/uL (ref 0.7–4.0)
MCH: 30.8 pg (ref 26.0–34.0)
MCHC: 33.9 g/dL (ref 30.0–36.0)
MCV: 90.7 fL (ref 78.0–100.0)
Monocytes Absolute: 0.5 10*3/uL (ref 0.1–1.0)
Monocytes Relative: 5 %
NEUTROS PCT: 74 %
Neutro Abs: 7.5 10*3/uL (ref 1.7–7.7)
Platelets: 370 10*3/uL (ref 150–400)
RBC: 4.42 MIL/uL (ref 3.87–5.11)
RDW: 13.7 % (ref 11.5–15.5)
WBC: 10.2 10*3/uL (ref 4.0–10.5)

## 2015-07-23 LAB — URINE MICROSCOPIC-ADD ON

## 2015-07-23 LAB — COMPREHENSIVE METABOLIC PANEL
ALT: 799 U/L — AB (ref 14–54)
ANION GAP: 9 (ref 5–15)
AST: 286 U/L — ABNORMAL HIGH (ref 15–41)
Albumin: 4 g/dL (ref 3.5–5.0)
Alkaline Phosphatase: 87 U/L (ref 38–126)
BUN: 13 mg/dL (ref 6–20)
CHLORIDE: 103 mmol/L (ref 101–111)
CO2: 22 mmol/L (ref 22–32)
Calcium: 9.2 mg/dL (ref 8.9–10.3)
Creatinine, Ser: 0.58 mg/dL (ref 0.44–1.00)
GFR calc non Af Amer: >60 mL/min (ref >60)
Glucose, Bld: 147 mg/dL — ABNORMAL HIGH (ref 65–99)
Potassium: 3.7 mmol/L (ref 3.5–5.1)
SODIUM: 134 mmol/L — AB (ref 135–145)
Total Bilirubin: 1 mg/dL (ref 0.3–1.2)
Total Protein: 8.1 g/dL (ref 6.5–8.1)

## 2015-07-23 LAB — URINALYSIS, ROUTINE W REFLEX MICROSCOPIC
Bilirubin Urine: NEGATIVE
GLUCOSE, UA: NEGATIVE mg/dL
Hgb urine dipstick: NEGATIVE
Ketones, ur: NEGATIVE mg/dL
Nitrite: POSITIVE — AB
PROTEIN: NEGATIVE mg/dL
Specific Gravity, Urine: 1.025 (ref 1.005–1.030)
Urobilinogen, UA: 1 mg/dL (ref 0.0–1.0)
pH: 6 (ref 5.0–8.0)

## 2015-07-23 LAB — LIPASE, BLOOD: LIPASE: 20 U/L (ref 11–51)

## 2015-07-23 MED ORDER — HYDROMORPHONE HCL 1 MG/ML IJ SOLN
1.0000 mg | Freq: Once | INTRAMUSCULAR | Status: AC
  Administered 2015-07-23: 1 mg via INTRAVENOUS
  Filled 2015-07-23: qty 1

## 2015-07-23 MED ORDER — SODIUM CHLORIDE 0.9 % IV SOLN
Freq: Once | INTRAVENOUS | Status: AC
  Administered 2015-07-23: 22:00:00 via INTRAVENOUS

## 2015-07-23 MED ORDER — PIPERACILLIN-TAZOBACTAM 3.375 G IVPB
3.3750 g | Freq: Three times a day (TID) | INTRAVENOUS | Status: DC
  Administered 2015-07-24 (×2): 3.375 g via INTRAVENOUS
  Filled 2015-07-23 (×2): qty 50

## 2015-07-23 MED ORDER — RISAQUAD PO CAPS
1.0000 | ORAL_CAPSULE | Freq: Every day | ORAL | Status: DC
  Administered 2015-07-24 – 2015-07-26 (×2): 1 via ORAL
  Filled 2015-07-23 (×3): qty 1

## 2015-07-23 MED ORDER — ONDANSETRON HCL 4 MG/2ML IJ SOLN: 4.0000 mg | Freq: Three times a day (TID) | INTRAMUSCULAR | Status: DC | PRN

## 2015-07-23 MED ORDER — SODIUM CHLORIDE 0.9 % IV BOLUS (SEPSIS)
1000.0000 mL | Freq: Once | INTRAVENOUS | Status: AC
  Administered 2015-07-23: 1000 mL via INTRAVENOUS

## 2015-07-23 MED ORDER — SODIUM CHLORIDE 0.9 % IV SOLN
Freq: Once | INTRAVENOUS | Status: AC
Start: 2015-07-23 — End: 2015-07-24
  Administered 2015-07-24: 125 mL/h via INTRAVENOUS

## 2015-07-23 MED ORDER — SERTRALINE HCL 50 MG PO TABS
150.0000 mg | ORAL_TABLET | Freq: Every day | ORAL | Status: DC
  Administered 2015-07-24 – 2015-07-26 (×2): 150 mg via ORAL
  Filled 2015-07-23 (×3): qty 1

## 2015-07-23 MED ORDER — DEXTROSE 5 % IV SOLN: 1.0000 g | Freq: Once | INTRAVENOUS | Status: DC

## 2015-07-23 MED ORDER — SODIUM CHLORIDE 0.9 % IV BOLUS (SEPSIS)
1000.0000 mL | Freq: Once | INTRAVENOUS | Status: AC
Start: 2015-07-23 — End: 2015-07-24
  Administered 2015-07-24: 1000 mL via INTRAVENOUS

## 2015-07-23 MED ORDER — PHENOBARBITAL 32.4 MG PO TABS
97.2000 mg | ORAL_TABLET | Freq: Every day | ORAL | Status: DC
  Administered 2015-07-24 – 2015-07-25 (×3): 97.2 mg via ORAL
  Filled 2015-07-23 (×3): qty 3

## 2015-07-23 MED ORDER — PREDNISONE 20 MG PO TABS: 20.0000 mg | ORAL_TABLET | ORAL | Status: DC

## 2015-07-23 MED ORDER — MORPHINE SULFATE (PF) 2 MG/ML IV SOLN
2.0000 mg | INTRAVENOUS | Status: DC | PRN
  Administered 2015-07-24 – 2015-07-25 (×5): 2 mg via INTRAVENOUS
  Filled 2015-07-23 (×5): qty 1

## 2015-07-23 MED ORDER — ONDANSETRON HCL 4 MG/2ML IJ SOLN
4.0000 mg | Freq: Once | INTRAMUSCULAR | Status: AC
  Administered 2015-07-23: 4 mg via INTRAVENOUS
  Filled 2015-07-23: qty 2

## 2015-07-23 MED ORDER — METOPROLOL TARTRATE 25 MG PO TABS
25.0000 mg | ORAL_TABLET | Freq: Two times a day (BID) | ORAL | Status: DC
  Administered 2015-07-24 – 2015-07-26 (×5): 25 mg via ORAL
  Filled 2015-07-23 (×7): qty 1

## 2015-07-23 MED ORDER — BUSPIRONE HCL 15 MG PO TABS
30.0000 mg | ORAL_TABLET | Freq: Two times a day (BID) | ORAL | Status: DC
  Administered 2015-07-24 – 2015-07-26 (×5): 30 mg via ORAL
  Filled 2015-07-23 (×7): qty 2

## 2015-07-23 MED ORDER — OXYCODONE-ACETAMINOPHEN 5-325 MG PO TABS
1.0000 | ORAL_TABLET | Freq: Four times a day (QID) | ORAL | Status: DC | PRN
  Administered 2015-07-24 – 2015-07-26 (×8): 2 via ORAL
  Filled 2015-07-23 (×8): qty 2

## 2015-07-23 NOTE — ED Provider Notes (Signed)
CSN: [id number]     Arrival date & time 07/23/15  1931 History   First MD Initiated Contact with Patient 07/23/15 1953     Chief Complaint  Patient presents with  . Abdominal Pain    diagnosed with gallstones      (Consider location/radiation/quality/duration/timing/severity/associated sxs/prior Treatment) HPI.... Right upper quadrant and epigastric pain for several days. Patient was evaluated at [facility] on Thursday evening and found to have cholelithiasis but no cholecystitis. Liver functions at that time were normal. She continues to have pain and nausea. No previous liver disease. She drinks alcohol but modestly.  Past Medical History  Diagnosis Date  . Hypertension   . Seizures (HCC)   . Depression   . PTSD (post-traumatic stress disorder)   . Palpitations    Past Surgical History  Procedure Laterality Date  . Dilation and curettage of uterus    . Induced abortion    . Facial cosmetic surgery     No family history on file. Social History  Substance Use Topics  . Smoking status: Former Smoker -- 0.50 packs/day    Types: Cigarettes  . Smokeless tobacco: None  . Alcohol Use: Yes     Comment: Rarely - Had 3 beers tonight   OB History    No data available     Review of Systems  All other systems reviewed and are negative.     Allergies  Dilantin and Imitrex  Home Medications   Prior to Admission medications   Medication Sig Start Date End Date Taking? Authorizing Provider  busPIRone (BUSPAR) 30 MG tablet Take 30 mg by mouth 2 (two) times daily.   Yes Historical Provider, MD  metoprolol tartrate (LOPRESSOR) 25 MG tablet Take 25 mg by mouth 2 (two) times daily. 07/10/15  Yes Historical Provider, MD  oxyCODONE-acetaminophen (PERCOCET) 5-325 MG tablet Take 1-2 tablets by mouth every 6 (six) hours as needed. Patient taking differently: Take 1-2 tablets by mouth every 6 (six) hours as needed for moderate pain or severe pain.  07/20/15  Yes Geoffery Lyonsouglas Delo,  MD  PHENobarbital (LUMINAL) 97.2 MG tablet Take 97.2 mg by mouth daily. 07/08/15  Yes Historical Provider, MD  predniSONE (DELTASONE) 20 MG tablet Take 20 mg by mouth See admin instructions. 6 day dose pack started 10/18   Yes Historical Provider, MD  Probiotic Product (PROBIOTIC PO) Take 1 tablet by mouth daily.   Yes Historical Provider, MD  sertraline (ZOLOFT) 100 MG tablet Take 150 mg by mouth daily.   Yes Historical Provider, MD  TURMERIC PO Take 400 mg by mouth daily.   Yes Historical Provider, MD   BP 121/83 mmHg  Pulse 76  Temp(Src) 97.9 F (36.6 C) (Oral)  Resp 18  SpO2 95%  LMP 07/01/2015 Physical Exam  Constitutional: She is oriented to person, place, and time. She appears well-developed and well-nourished.  HENT:  Head: Normocephalic and atraumatic.  Eyes: Conjunctivae and EOM are normal. Pupils are equal, round, and reactive to light.  Neck: Normal range of motion. Neck supple.  Cardiovascular: Normal rate and regular rhythm.   Pulmonary/Chest: Effort normal and breath sounds normal.  Abdominal: Bowel sounds are normal.  Tender right upper quadrant  Musculoskeletal: Normal range of motion.  Neurological: She is alert and oriented to person, place, and time.  Skin: Skin is warm and dry.  Psychiatric: She has a normal mood and affect. Her behavior is normal.  Nursing note and vitals reviewed.   ED Course  Procedures (including  critical care time) Labs Review Labs Reviewed  COMPREHENSIVE METABOLIC PANEL - Abnormal; Notable for the following:    Sodium 134 (*)    Glucose, Bld 147 (*)    AST 286 (*)    ALT 799 (*)    All other components within normal limits  URINALYSIS, ROUTINE W REFLEX MICROSCOPIC (NOT AT [facility]) - Abnormal; Notable for the following:    APPearance CLOUDY (*)    Nitrite POSITIVE (*)    Leukocytes, UA SMALL (*)    All other components within normal limits  URINE MICROSCOPIC-ADD ON - Abnormal; Notable for the following:    Bacteria, UA MANY (*)     Crystals CA OXALATE CRYSTALS (*)    All other components within normal limits  CBC WITH DIFFERENTIAL/PLATELET  LIPASE, BLOOD  HEPATITIS PANEL, ACUTE    Imaging Review US Abdomen Limited  07/23/2015  CLINICAL DATA:  39 year old female with right upper quadrant abdominal pain. EXAM: US ABDOMEN LIMITED - RIGHT UPPER QUADRANT COMPARISON:  Abdominal ultrasound 07/20/2015. FINDINGS: Gallbladder: Small echogenic foci with distal acoustic shadowing measuring up to 8 mm in diameter, compatible small gallstones. Gallbladder does not appear distended. Gallbladder wall thickness is normal at 2.8 mm. No pericholecystic fluid. Per report from the sonographer, the patient did exhibit a sonographic Murphy's sign on examination. Common bile duct: Diameter: 3.4 mm in the porta hepatis. Liver: No focal lesion identified. Within normal limits in parenchymal echogenicity. IMPRESSION: 1. Study is positive for cholelithiasis. Although there is no distention of the gallbladder, no gallbladder wall thickening and no pericholecystic fluid, per report from the sonographer, the patient did exhibit a positive sonographic Murphy's sign. Findings are overall equivocal, but concerning for potential acute cholecystitis and further clinical correlation is recommended. Electronically Signed   By: Trudie Reed M.D.   On: 07/23/2015 23:15   I have personally reviewed and evaluated these images and lab results as part of my medical decision-making.   EKG Interpretation None      MDM   Final diagnoses:  Calculus of gallbladder without cholecystitis without obstruction  Elevated liver function tests    Ultrasound reviewed from 07/20/15.  Patient has cholecystitis, but not cholelithiasis.  White count normal tonight, but liver functions grossly elevated. Discussed with Dr. Wenda Low, general surgery. He recommended a repeat ultrasound and consultation with GI.   Discussed with Dr. Sharia Reeve. Admit to general  medicine.    Donnetta Hutching, MD 07/23/15 2325

## 2015-07-23 NOTE — H&P (Signed)
[facility] History and Physical  Brooke Avila ZOX:[id number] DOB: 1976/02/25 DOA: 07/23/2015  Referring physician: ED physician PCP: [name], MD  Specialists:   Chief Complaint: Abdominal pain, nausea vomiting  HPI: Brooke Avila is a 39 y.o. female with PMH of hypertension, depression, seizure, PTSD, left hand finger pain, who presents with abdominal pain, nausea and vomiting  Patient reports that she started having abdominal pain 4 days ago. It is located in the epigastric area and right upper quadrant. It is constant, dull, nonradiating. It is not aggravated or alleviated with any known factors. It is associated with nausea and vomiting. She vomited twice today. No diarrhea, fever or chills. Patient was seen in [facility] on 07/20/15, and had abdominal ultrasound which showed gallstone with positive Murphy sign. She was discharged home and scheduled with surgery follow-up on 08/03/15. After she went home, her symptoms has been getting worse, therefore she comes back to the hospital for further evaluation and treatment. She reports that she has burning and increased urinary frequency, but no dysuria. Pt does not have chest pain, shortness of breath, cough, rashes, unilateral weakness.  In ED, patient was found to have abnormal liver function with ALP 87, AST 286, ALT 799, total bilirubin 1.0 (her liver function was normal on 07/20/15), WBC 10.2, temperature normal, no tachycardia, positive urinalysis for UTI. Electrolytes okay. Abdominal ultrasound showed gallstone and positive Murphy's sign sign. Patient is admitted to inpatient for further evaluation and treatment. General surgeon and GI were consulted by ED.  Where does patient live?   At home    Can patient participate in ADLs?  Yes    Review of Systems:   General: no fevers, chills, no changes in body weight, has poor appetite, has fatigue HEENT: no blurry vision, hearing changes or sore throat Pulm: no dyspnea, coughing,  wheezing CV: no chest pain, palpitations Abd: has nausea, vomiting, abdominal pain, no diarrhea, constipation GU: no dysuria, has burning on urination, increased urinary frequency, no hematuria  Ext: no leg edema Neuro: no unilateral weakness, numbness, or tingling, no vision change or hearing loss Skin: no rash MSK: No muscle spasm, no deformity, no limitation of range of movement in spin Heme: No easy bruising.  Travel history: No recent long distant travel.  Allergy:  Allergies  Allergen Reactions  . Dilantin [Phenytoin Sodium Extended]     hives  . Imitrex [Sumatriptan] Other (See Comments)    Fatigue    Past Medical History  Diagnosis Date  . Hypertension   . Seizures (HCC)   . Depression   . PTSD (post-traumatic stress disorder)   . Palpitations     Past Surgical History  Procedure Laterality Date  . Dilation and curettage of uterus    . Induced abortion    . Facial cosmetic surgery      Social History:  reports that she has quit smoking. Her smoking use included Cigarettes. She smoked 0.50 packs per day. She does not have any smokeless tobacco history on file. She reports that she drinks alcohol. She reports that she does not use illicit drugs.  Family History: Reviewed, but patient does not know any family medical history  Prior to Admission medications   Medication Sig Start Date End Date Taking? Authorizing Provider  busPIRone (BUSPAR) 30 MG tablet Take 30 mg by mouth 2 (two) times daily.   Yes Historical Provider, MD  metoprolol tartrate (LOPRESSOR) 25 MG tablet Take 25 mg by mouth 2 (two) times daily. 07/10/15  Yes  Historical Provider, MD  oxyCODONE-acetaminophen (PERCOCET) 5-325 MG tablet Take 1-2 tablets by mouth every 6 (six) hours as needed. Patient taking differently: Take 1-2 tablets by mouth every 6 (six) hours as needed for moderate pain or severe pain.  07/20/15  Yes Geoffery Lyonsouglas Delo, MD  PHENobarbital (LUMINAL) 97.2 MG tablet Take 97.2 mg by mouth  daily. 07/08/15  Yes Historical Provider, MD  predniSONE (DELTASONE) 20 MG tablet Take 20 mg by mouth See admin instructions. 6 day dose pack started 10/18   Yes Historical Provider, MD  Probiotic Product (PROBIOTIC PO) Take 1 tablet by mouth daily.   Yes Historical Provider, MD  sertraline (ZOLOFT) 100 MG tablet Take 150 mg by mouth daily.   Yes Historical Provider, MD  TURMERIC PO Take 400 mg by mouth daily.   Yes Historical Provider, MD    Physical Exam: Filed Vitals:   07/23/15 1935 07/23/15 2204 07/24/15 0016  BP: 145/70 121/83 133/83  Pulse: 97 76 66  Temp: 97.9 F (36.6 C)  98.3 F (36.8 C)  TempSrc: Oral  Oral  Resp: 20 18 18   Weight:   87.635 kg (193 lb 3.2 oz)  SpO2: 98% 95% 97%   General: Not in acute distress HEENT:       Eyes: PERRL, EOMI, no scleral icterus.       ENT: No discharge from the ears and nose, no pharynx injection, no tonsillar enlargement.        Neck: No JVD, no bruit, no mass felt. Heme: No neck lymph node enlargement. Cardiac: S1/S2, RRR, No murmurs, No gallops or rubs. Pulm: No rales, wheezing, rhonchi or rubs. Abd: Soft, nondistended, tenderness over epigastric and RUQ, no rebound pain, no organomegaly, BS present. Ext: No pitting leg edema bilaterally. 2+DP/PT pulse bilaterally. Musculoskeletal: has pain over PIP and DIP joints in Left hand without redness, warmness or swelling.  Skin: No rashes.  Neuro: Alert, oriented X3, cranial nerves II-XII grossly intact, muscle strength 5/5 in all extremities, sensation to light touch intact. Psych: Patient is not psychotic, no suicidal or hemocidal ideation.  Labs on Admission:  Basic Metabolic Panel:  Recent Labs Lab 07/20/15 1900 07/23/15 2000  NA 134* 134*  K 3.8 3.7  CL 106 103  CO2 22 22  GLUCOSE 101* 147*  BUN 16 13  CREATININE 0.49 0.58  CALCIUM 8.8* 9.2   Liver Function Tests:  Recent Labs Lab 07/20/15 1900 07/23/15 2000  AST 30 286*  ALT 29 799*  ALKPHOS 48 87  BILITOT 0.3  1.0  PROT 7.2 8.1  ALBUMIN 3.9 4.0    Recent Labs Lab 07/20/15 1900 07/23/15 2000  LIPASE 28 20   No results for input(s): AMMONIA in the last 168 hours. CBC:  Recent Labs Lab 07/20/15 1900 07/23/15 2000  WBC 9.3 10.2  NEUTROABS 6.5 7.5  HGB 13.0 13.6  HCT 38.7 40.1  MCV 90.6 90.7  PLT 350 370   Cardiac Enzymes:  Recent Labs Lab 07/20/15 1900  TROPONINI <0.03    BNP (last 3 results) No results for input(s): BNP in the last 8760 hours.  ProBNP (last 3 results) No results for input(s): PROBNP in the last 8760 hours.  CBG: No results for input(s): GLUCAP in the last 168 hours.  Radiological Exams on Admission: [place] Abdomen Limited  07/23/2015  CLINICAL DATA:  39 year old female with right upper quadrant abdominal pain. EXAM: US ABDOMEN LIMITED - RIGHT UPPER QUADRANT COMPARISON:  Abdominal ultrasound 07/20/2015. FINDINGS: Gallbladder: Small echogenic foci with distal acoustic  shadowing measuring up to 8 mm in diameter, compatible small gallstones. Gallbladder does not appear distended. Gallbladder wall thickness is normal at 2.8 mm. No pericholecystic fluid. Per report from the sonographer, the patient did exhibit a sonographic Murphy's sign on examination. Common bile duct: Diameter: 3.4 mm in the porta hepatis. Liver: No focal lesion identified. Within normal limits in parenchymal echogenicity. IMPRESSION: 1. Study is positive for cholelithiasis. Although there is no distention of the gallbladder, no gallbladder wall thickening and no pericholecystic fluid, per report from the sonographer, the patient did exhibit a positive sonographic Murphy's sign. Findings are overall equivocal, but concerning for potential acute cholecystitis and further clinical correlation is recommended. Electronically Signed   By: Trudie Reed M.D.   On: 07/23/2015 23:15    EKG: Not done in ED, will get one.   Assessment/Plan Principal Problem:   Abdominal pain Active Problems:    Cholelithiasis   Hypertension   Seizures (HCC)   Depression   UTI (lower urinary tract infection)   Finger pain, left   Abdominal pain: Most likely due to gallstones and possible cholecystitis given positive Murphy sign. Patient is not septic on admission. Hemodynamically stable. General surgery and a GI workup started by ED.  -will admit to med-surg bed -start IV zosyn which is also for possible UTI as below -IVf: 2L NS and then 125 cc/h -When necessary Zofran for nausea and morphine for pain -Follow-up GI and general surgeon's recommendation -check hepatitis panel and HIV antibody given abnormal liver function -Protonix empicaly.  UTI: Patient has burning and increased urinary frequency and positive urinalysis, consistent with UTI. -on IV zosyn as above -f/u Ux  Hypertension: -continue metoprolol  Seizures (HCC): Well controlled. Last seizure was 3 years ago -Continue luminal  Depression: Stable, no suicidal or homicidal ideations. -Continue home medications: BuSpar and Zoloft  Finger pain, left: Diagnosis not clear. Patient has been treated by NP, Delena Serve. On prednisone tapering. Currently on 20 mg daily, need to take for 3 more days. Symptoms improved. -Prednisone 20 mg daily for 3 more days.  DVT ppx: SCD  Code Status: Full code Family Communication: Yes, patient's husband at bed side Disposition Plan: Admit to inpatient   Date of Service 07/24/2015    Lorretta Harp [facility] Pager [phone]  If 7PM-7AM, please contact night-coverage [email or web address] Password [email or web address] 07/24/2015, 12:20 AM

## 2015-07-23 NOTE — ED Notes (Signed)
Pt from home has had intense pain since Thursday. Pt was referred to a surgeon, but she couldn't get an appointment until November 3. Pt reports her last bowel movement was today and has 2 episodes of vomiting today. Pt has upper right quadrant pain that she reports as constant and dull. She states "her insides feel like they are on fire".

## 2015-07-23 NOTE — ED Notes (Signed)
MD at bedside. 

## 2015-07-24 ENCOUNTER — Inpatient Hospital Stay (HOSPITAL_COMMUNITY): Payer: Self-pay

## 2015-07-24 ENCOUNTER — Encounter (HOSPITAL_COMMUNITY): Payer: Self-pay | Admitting: *Deleted

## 2015-07-24 DIAGNOSIS — R109 Unspecified abdominal pain: Secondary | ICD-10-CM

## 2015-07-24 DIAGNOSIS — K8020 Calculus of gallbladder without cholecystitis without obstruction: Secondary | ICD-10-CM | POA: Insufficient documentation

## 2015-07-24 DIAGNOSIS — I10 Essential (primary) hypertension: Secondary | ICD-10-CM | POA: Insufficient documentation

## 2015-07-24 DIAGNOSIS — R7989 Other specified abnormal findings of blood chemistry: Secondary | ICD-10-CM | POA: Insufficient documentation

## 2015-07-24 DIAGNOSIS — R945 Abnormal results of liver function studies: Secondary | ICD-10-CM

## 2015-07-24 DIAGNOSIS — K8070 Calculus of gallbladder and bile duct without cholecystitis without obstruction: Secondary | ICD-10-CM

## 2015-07-24 LAB — COMPREHENSIVE METABOLIC PANEL
ALBUMIN: 3.7 g/dL (ref 3.5–5.0)
ALK PHOS: 76 U/L (ref 38–126)
ALT: 623 U/L — ABNORMAL HIGH (ref 14–54)
ANION GAP: 6 (ref 5–15)
AST: 197 U/L — ABNORMAL HIGH (ref 15–41)
BUN: 12 mg/dL (ref 6–20)
CALCIUM: 8.7 mg/dL — AB (ref 8.9–10.3)
CO2: 23 mmol/L (ref 22–32)
Chloride: 108 mmol/L (ref 101–111)
Creatinine, Ser: 0.49 mg/dL (ref 0.44–1.00)
GFR calc non Af Amer: >60 mL/min (ref >60)
Glucose, Bld: 86 mg/dL (ref 65–99)
POTASSIUM: 3.7 mmol/L (ref 3.5–5.1)
SODIUM: 137 mmol/L (ref 135–145)
Total Bilirubin: 0.8 mg/dL (ref 0.3–1.2)
Total Protein: 7 g/dL (ref 6.5–8.1)

## 2015-07-24 LAB — ABO/RH: ABO/RH(D): A POS

## 2015-07-24 LAB — HIV ANTIBODY (ROUTINE TESTING W REFLEX): HIV SCREEN 4TH GENERATION: NONREACTIVE

## 2015-07-24 LAB — TYPE AND SCREEN
ABO/RH(D): A POS
ANTIBODY SCREEN: NEGATIVE

## 2015-07-24 LAB — GLUCOSE, CAPILLARY
GLUCOSE-CAPILLARY: 69 mg/dL (ref 65–99)
Glucose-Capillary: 109 mg/dL — ABNORMAL HIGH (ref 65–99)

## 2015-07-24 LAB — CBC
HEMATOCRIT: 37.5 % (ref 36.0–46.0)
HEMOGLOBIN: 12.6 g/dL (ref 12.0–15.0)
MCH: 30.9 pg (ref 26.0–34.0)
MCHC: 33.6 g/dL (ref 30.0–36.0)
MCV: 91.9 fL (ref 78.0–100.0)
Platelets: 348 10*3/uL (ref 150–400)
RBC: 4.08 MIL/uL (ref 3.87–5.11)
RDW: 13.9 % (ref 11.5–15.5)
WBC: 10.3 10*3/uL (ref 4.0–10.5)

## 2015-07-24 LAB — HCG, QUANTITATIVE, PREGNANCY

## 2015-07-24 LAB — PROTIME-INR
INR: 1.11 (ref 0.00–1.49)
Prothrombin Time: 14.5 seconds (ref 11.6–15.2)

## 2015-07-24 LAB — SURGICAL PCR SCREEN
MRSA, PCR: NEGATIVE
STAPHYLOCOCCUS AUREUS: POSITIVE — AB

## 2015-07-24 LAB — APTT: APTT: 28 s (ref 24–37)

## 2015-07-24 MED ORDER — DEXTROSE 5 % IV SOLN
1.0000 g | INTRAVENOUS | Status: DC
  Administered 2015-07-24 – 2015-07-25 (×3): 1 g via INTRAVENOUS
  Filled 2015-07-24 (×4): qty 10

## 2015-07-24 MED ORDER — IOHEXOL 300 MG/ML  SOLN
25.0000 mL | INTRAMUSCULAR | Status: AC
  Administered 2015-07-24 (×2): 25 mL via ORAL

## 2015-07-24 MED ORDER — SODIUM CHLORIDE 0.9 % IV SOLN
INTRAVENOUS | Status: DC
  Administered 2015-07-24 – 2015-07-26 (×5): via INTRAVENOUS

## 2015-07-24 MED ORDER — PANTOPRAZOLE SODIUM 40 MG PO TBEC
40.0000 mg | DELAYED_RELEASE_TABLET | Freq: Every day | ORAL | Status: DC
  Administered 2015-07-24 – 2015-07-26 (×2): 40 mg via ORAL
  Filled 2015-07-24 (×3): qty 1

## 2015-07-24 MED ORDER — PREDNISONE 20 MG PO TABS
20.0000 mg | ORAL_TABLET | Freq: Every day | ORAL | Status: AC
  Administered 2015-07-24 – 2015-07-26 (×3): 20 mg via ORAL
  Filled 2015-07-24 (×4): qty 1

## 2015-07-24 MED ORDER — IOHEXOL 300 MG/ML  SOLN
100.0000 mL | Freq: Once | INTRAMUSCULAR | Status: AC | PRN
  Administered 2015-07-24: 100 mL via INTRAVENOUS

## 2015-07-24 NOTE — Progress Notes (Addendum)
[facility] PROGRESS NOTE  Brooke Avila:[id number] DOB: Oct 23, 1975 DOA: 07/23/2015 PCP: Neldon LabellaMILLER,Brooke LYNN, MD  Assessment/Plan: 1. Abd pain/Symptomatic cholelithiasis vs Cholecystitis -WBC normal, but she is on prednisone which can suppress immune response  -elevated LFTs, was normal on 10/20 -[facility] following -will get CT Abdomen -IVF, PPI -had intermittent abd pain for years after meals-less severe, saw Dr.Outlaw, had EGD and Colonoscopy -normal, but pain much more intense since thursday  2. Possible UTI -IV ceftriaxone, FU Urine Cx  3. Hypertension: -continue metoprolol  4. Seizures (HCC): Well controlled. Last seizure was 3 years ago -Continue luminal  5. Depression: Stable, -ContinueBuSpar and Zoloft  6. Arthralgia/? Synovitis of DIP and PIP of L hand: -FU with Rheum, on Prednisone per PCP,Currently on 20 mg daily, need to take for 3 more days. Symptoms improved.  DVT ppx: SCD  Code Status: Full COde Family Communication: spouse at bedside Disposition Plan:    Consultants:  CCS  HPI/Subjective: Having Abd pain, Morphine helping  Objective: Filed Vitals:   07/24/15 0500  BP: 139/83  Pulse: 64  Temp: 97.7 F (36.5 C)  Resp: 20    Intake/Output Summary (Last 24 hours) at 07/24/15 1043 Last data filed at 07/24/15 0500  Gross per 24 hour  Intake     60 ml  Output    575 ml  Net   -515 ml   Filed Weights   07/24/15 0016  Weight: 87.635 kg (193 lb 3.2 oz)    Exam:   General:  AAOx3  Cardiovascular: S1S2/RRR  Respiratory: soft, NT, BS present  Abdomen: soft, R UQ, RLQ tender, BS present, Murphys +/_, distended  Musculoskeletal: no edema c/c  Data Reviewed: Basic Metabolic Panel:  Recent Labs Lab 07/20/15 1900 07/23/15 2000 07/24/15 0035  NA 134* 134* 137  K 3.8 3.7 3.7  CL 106 103 108  CO2 22 22 23   GLUCOSE 101* 147* 86  BUN 16 13 12   CREATININE 0.49 0.58 0.49  CALCIUM 8.8* 9.2 8.7*   Liver Function Tests:  Recent  Labs Lab 07/20/15 1900 07/23/15 2000 07/24/15 0035  AST 30 286* 197*  ALT 29 799* 623*  ALKPHOS 48 87 76  BILITOT 0.3 1.0 0.8  PROT 7.2 8.1 7.0  ALBUMIN 3.9 4.0 3.7    Recent Labs Lab 07/20/15 1900 07/23/15 2000  LIPASE 28 20   No results for input(s): AMMONIA in the last 168 hours. CBC:  Recent Labs Lab 07/20/15 1900 07/23/15 2000 07/24/15 0035  WBC 9.3 10.2 10.3  NEUTROABS 6.5 7.5  --   HGB 13.0 13.6 12.6  HCT 38.7 40.1 37.5  MCV 90.6 90.7 91.9  PLT 350 370 348   Cardiac Enzymes:  Recent Labs Lab 07/20/15 1900  TROPONINI <0.03   BNP (last 3 results) No results for input(s): BNP in the last 8760 hours.  ProBNP (last 3 results) No results for input(s): PROBNP in the last 8760 hours.  CBG:  Recent Labs Lab 07/24/15 0742  GLUCAP 69    Recent Results (from the past 240 hour(s))  Surgical pcr screen     Status: Abnormal   Collection Time: 07/24/15  1:28 AM  Result Value Ref Range Status   MRSA, PCR NEGATIVE NEGATIVE Final   Staphylococcus aureus POSITIVE (A) NEGATIVE Final    Comment:        The Xpert SA Assay (FDA approved for NASAL specimens in patients over 39 years of age), is one component of a comprehensive surveillance program.  Test performance  has been validated by [facility] for patients greater than or equal to 32 year old. It is not intended to diagnose infection nor to guide or monitor treatment.      Studies: US Abdomen Limited  07/23/2015  CLINICAL DATA:  39 year old female with right upper quadrant abdominal pain. EXAM: US ABDOMEN LIMITED - RIGHT UPPER QUADRANT COMPARISON:  Abdominal ultrasound 07/20/2015. FINDINGS: Gallbladder: Small echogenic foci with distal acoustic shadowing measuring up to 8 mm in diameter, compatible small gallstones. Gallbladder does not appear distended. Gallbladder wall thickness is normal at 2.8 mm. No pericholecystic fluid. Per report from the sonographer, the patient did exhibit a sonographic  Murphy's sign on examination. Common bile duct: Diameter: 3.4 mm in the porta hepatis. Liver: No focal lesion identified. Within normal limits in parenchymal echogenicity. IMPRESSION: 1. Study is positive for cholelithiasis. Although there is no distention of the gallbladder, no gallbladder wall thickening and no pericholecystic fluid, per report from the sonographer, the patient did exhibit a positive sonographic Murphy's sign. Findings are overall equivocal, but concerning for potential acute cholecystitis and further clinical correlation is recommended. Electronically Signed   By: Trudie Reed M.D.   On: 07/23/2015 23:15    Scheduled Meds: . acidophilus  1 capsule Oral Daily  . busPIRone  30 mg Oral BID  . metoprolol tartrate  25 mg Oral BID  . pantoprazole  40 mg Oral Q1200  . PHENobarbital  97.2 mg Oral Daily  . piperacillin-tazobactam (ZOSYN)  IV  3.375 g Intravenous 3 times per day  . predniSONE  20 mg Oral Q breakfast  . sertraline  150 mg Oral Daily   Continuous Infusions:  Antibiotics Given (last 72 hours)    Date/Time Action Medication Dose Rate   07/24/15 0201 Given  [had to notify]   piperacillin-tazobactam (ZOSYN) IVPB 3.375 g 3.375 g 12.5 mL/hr   07/24/15 0750 Given   piperacillin-tazobactam (ZOSYN) IVPB 3.375 g 3.375 g 12.5 mL/hr      Principal Problem:   Abdominal pain Active Problems:   Cholelithiasis   Hypertension   Seizures (HCC)   Depression   UTI (lower urinary tract infection)   Finger pain, left    Time spent:    [facility]  [facility] Pager [phone]. If 7PM-7AM, please contact night-coverage at [email or web address], password [facility] 07/24/2015, 10:43 AM  LOS: 1 day

## 2015-07-24 NOTE — Progress Notes (Signed)
Pt refusing EKG had one 10/20 NSR copy in chart thank you Doren CustardBeverly, Merideth Bosque D

## 2015-07-24 NOTE — Consult Note (Signed)
Reason for Consult:  Symptomatic gallstone Referring Physician:  Dr. [name] is an 39 y.o. female.  HPI: 39 y/o admitted last PM with abdominal pain, nausea and vomiting. She was seen in the ED 07/20/15 at [facility] with the same issue. She had a abdominal US with Gallstones. No GB wall thickening, CBD 5 mm. She was to follow up with surgery on 08/03/15.  She returns now with worsening pain mid and RUQ portions of the abdomen, nausea and vomiting. Pain is not associated with PO intake. Pain is all over, but currently mostly in the mid epigastric area, and RUQ. Work up in the ED shows she is afebrile, and VSS. She has labs at 8 PM and 1235 AM that shows AST and ALT are up, but improved. Repeat US last PM shows cholelithiasis up to 8 mm, GB, is not distened, GB wall is not thickened, CBD is 3.4 mm. She has issues with nausea and vomiting yesterday, but has not vomited here. She says pain is all over 24/7, nothing is making it better or worse.  Past Medical History  Diagnosis Date   Hx of drug abuse, clean for 2 years prior to this    Possible arthritis on steroids for finger and shoulder pain Started last week by [facility]   Hx of tachycardia - On Lopressor for this.   . Hypertension   . Seizures ([employer]) None since 2013   . Depression   . PTSD (post-traumatic stress disorder) Recently diagnosed   . Palpitations     Past Surgical History  Procedure Laterality Date  . Dilation and curettage of uterus    . Induced abortion    . Facial cosmetic surgery      No family history on file.  Social History:  reports that she has quit smoking. Her smoking use included Cigarettes. She smoked 0.50 packs per day. She does not have any smokeless tobacco history on file. She reports that she drinks alcohol. She reports that she does not use illicit drugs. Tobacco: 2 PPD, quit 2 years ago, 24 year history ETOH: Social Drugs: Has  been using Marijuana for pain control since discharge 07/20/15.   Allergies:  Allergies  Allergen Reactions  . Dilantin [Phenytoin Sodium Extended]     hives  . Imitrex [Sumatriptan] Other (See Comments)    Fatigue    Medications:  Prior to Admission:  Prescriptions prior to admission  Medication Sig Dispense Refill Last Dose  . busPIRone (BUSPAR) 30 MG tablet Take 30 mg by mouth 2 (two) times daily.   07/23/2015 at Unknown time  . metoprolol tartrate (LOPRESSOR) 25 MG tablet Take 25 mg by mouth 2 (two) times daily.  0 07/23/2015 at Unknown time  . oxyCODONE-acetaminophen (PERCOCET) 5-325 MG tablet Take 1-2 tablets by mouth every 6 (six) hours as needed. (Patient taking differently: Take 1-2 tablets by mouth every 6 (six) hours as needed for moderate pain or severe pain. ) 20 tablet 0 07/23/2015 at 1830  . PHENobarbital (LUMINAL) 97.2 MG tablet Take 97.2 mg by mouth daily.  1 07/22/2015 at Unknown time  . predniSONE (DELTASONE) 20 MG tablet Take 20 mg by mouth See admin instructions. 6 day dose pack started 10/18   07/23/2015 at Unknown time  . Probiotic Product (PROBIOTIC PO) Take 1 tablet by mouth daily.   2 weeks  . sertraline (ZOLOFT) 100 MG tablet Take 150 mg by mouth daily.   07/23/2015 at Unknown time  . TURMERIC PO Take  400 mg by mouth daily.   2 weeks   Scheduled: . acidophilus 1 capsule Oral Daily  . busPIRone 30 mg Oral BID  . metoprolol tartrate 25 mg Oral BID  . pantoprazole 40 mg Oral Q1200  . PHENobarbital 97.2 mg Oral Daily  . piperacillin-tazobactam (ZOSYN) IV 3.375 g Intravenous 3 times per day  . predniSONE 20 mg Oral Q breakfast  . sertraline 150 mg Oral Daily   Continuous:  JAS:NKNLZJQB injection, ondansetron (ZOFRAN) IV, oxyCODONE-acetaminophen Anti-infectives    Start   Dose/Rate Route Frequency Ordered Stop   07/24/15  0000  cefTRIAXone (ROCEPHIN) 1 g in dextrose 5 % 50 mL IVPB Status: Discontinued    1 g 100 mL/hr over 30 Minutes Intravenous Once 07/23/15 2345 07/23/15 2357   07/24/15 0000  piperacillin-tazobactam (ZOSYN) IVPB 3.375 g    3.375 g 12.5 mL/hr over 240 Minutes Intravenous 3 times per day 07/23/15 2357        Lab Results Last 48 Hours    Results for orders placed or performed during the hospital encounter of 07/23/15 (from the past 48 hour(s))  CBC with Differential Status: None   Collection Time: 07/23/15 8:00 PM  Result Value Ref Range   WBC 10.2 4.0 - 10.5 K/uL   RBC 4.42 3.87 - 5.11 MIL/uL   Hemoglobin 13.6 12.0 - 15.0 g/dL   HCT 40.1 36.0 - 46.0 %   MCV 90.7 78.0 - 100.0 fL   MCH 30.8 26.0 - 34.0 pg   MCHC 33.9 30.0 - 36.0 g/dL   RDW 13.7 11.5 - 15.5 %   Platelets 370 150 - 400 K/uL   Neutrophils Relative % 74 %   Neutro Abs 7.5 1.7 - 7.7 K/uL   Lymphocytes Relative 21 %   Lymphs Abs 2.2 0.7 - 4.0 K/uL   Monocytes Relative 5 %   Monocytes Absolute 0.5 0.1 - 1.0 K/uL   Eosinophils Relative 0 %   Eosinophils Absolute 0.0 0.0 - 0.7 K/uL   Basophils Relative 0 %   Basophils Absolute 0.0 0.0 - 0.1 K/uL  Lipase, blood Status: None   Collection Time: 07/23/15 8:00 PM  Result Value Ref Range   Lipase 20 11 - 51 U/L    Comment: Please note change in reference range.  Comprehensive metabolic panel Status: Abnormal   Collection Time: 07/23/15 8:00 PM  Result Value Ref Range   Sodium 134 (L) 135 - 145 mmol/L   Potassium 3.7 3.5 - 5.1 mmol/L   Chloride 103 101 - 111 mmol/L   CO2 22 22 - 32 mmol/L   Glucose, Bld 147 (H) 65 - 99 mg/dL   BUN 13 6 - 20 mg/dL   Creatinine, Ser 0.58 0.44 - 1.00 mg/dL   Calcium 9.2 8.9 - 10.3 mg/dL   Total Protein 8.1 6.5 - 8.1 g/dL   Albumin 4.0 3.5 - 5.0 g/dL    AST 286 (H) 15 - 41 U/L   ALT 799 (H) 14 - 54 U/L   Alkaline Phosphatase 87 38 - 126 U/L   Total Bilirubin 1.0 0.3 - 1.2 mg/dL   GFR calc non Af Amer >60 >60 mL/min   GFR calc Af Amer >60 >60 mL/min    Comment: (NOTE) The eGFR has been calculated using the CKD EPI equation. This calculation has not been validated in all clinical situations. eGFR's persistently <60 mL/min signify possible Chronic Kidney Disease.    Anion gap 9 5 - 15  Urinalysis, Routine w reflex microscopic Status:  Abnormal   Collection Time: 07/23/15 8:17 PM  Result Value Ref Range   Color, Urine YELLOW YELLOW   APPearance CLOUDY (A) CLEAR   Specific Gravity, Urine 1.025 1.005 - 1.030   pH 6.0 5.0 - 8.0   Glucose, UA NEGATIVE NEGATIVE mg/dL   Hgb urine dipstick NEGATIVE NEGATIVE   Bilirubin Urine NEGATIVE NEGATIVE   Ketones, ur NEGATIVE NEGATIVE mg/dL   Protein, ur NEGATIVE NEGATIVE mg/dL   Urobilinogen, UA 1.0 0.0 - 1.0 mg/dL   Nitrite POSITIVE (A) NEGATIVE   Leukocytes, UA SMALL (A) NEGATIVE  Urine microscopic-add on Status: Abnormal   Collection Time: 07/23/15 8:17 PM  Result Value Ref Range   WBC, UA 3-6 <3 WBC/hpf   RBC / HPF 0-2 <3 RBC/hpf   Bacteria, UA MANY (A) RARE   Crystals CA OXALATE CRYSTALS (A) NEGATIVE  hCG, quantitative, pregnancy Status: None   Collection Time: 07/24/15 12:35 AM  Result Value Ref Range   hCG, Beta Chain, Quant, S <1 <5 mIU/mL    Comment:    GEST. AGE CONC. (mIU/mL)  <=1 WEEK 5 - 50  2 WEEKS 50 - 500  3 WEEKS 100 - 10,000  4 WEEKS 1,000 - 30,000  5 WEEKS 3,500 - 115,000  6-8 WEEKS 12,000 - 270,000  12 WEEKS 15,000 - 220,000   FEMALE AND NON-PREGNANT FEMALE:  LESS THAN 5 mIU/mL   Protime-INR Status: None   Collection Time: 07/24/15 12:35 AM  Result  Value Ref Range   Prothrombin Time 14.5 11.6 - 15.2 seconds   INR 1.11 0.00 - 1.49  APTT Status: None   Collection Time: 07/24/15 12:35 AM  Result Value Ref Range   aPTT 28 24 - 37 seconds  Type and screen [place] Status: None   Collection Time: 07/24/15 12:35 AM  Result Value Ref Range   ABO/RH(D) A POS    Antibody Screen NEG    Sample Expiration 07/27/2015   Comprehensive metabolic panel Status: Abnormal   Collection Time: 07/24/15 12:35 AM  Result Value Ref Range   Sodium 137 135 - 145 mmol/L   Potassium 3.7 3.5 - 5.1 mmol/L   Chloride 108 101 - 111 mmol/L   CO2 23 22 - 32 mmol/L   Glucose, Bld 86 65 - 99 mg/dL   BUN 12 6 - 20 mg/dL   Creatinine, Ser 0.49 0.44 - 1.00 mg/dL   Calcium 8.7 (L) 8.9 - 10.3 mg/dL   Total Protein 7.0 6.5 - 8.1 g/dL   Albumin 3.7 3.5 - 5.0 g/dL   AST 197 (H) 15 - 41 U/L   ALT 623 (H) 14 - 54 U/L   Alkaline Phosphatase 76 38 - 126 U/L   Total Bilirubin 0.8 0.3 - 1.2 mg/dL   GFR calc non Af Amer >60 >60 mL/min   GFR calc Af Amer >60 >60 mL/min    Comment: (NOTE) The eGFR has been calculated using the CKD EPI equation. This calculation has not been validated in all clinical situations. eGFR's persistently <60 mL/min signify possible Chronic Kidney Disease.    Anion gap 6 5 - 15  CBC Status: None   Collection Time: 07/24/15 12:35 AM  Result Value Ref Range   WBC 10.3 4.0 - 10.5 K/uL   RBC 4.08 3.87 - 5.11 MIL/uL   Hemoglobin 12.6 12.0 - 15.0 g/dL   HCT 37.5 36.0 - 46.0 %   MCV 91.9 78.0 - 100.0 fL   MCH 30.9 26.0 - 34.0 pg  MCHC 33.6 30.0 - 36.0 g/dL   RDW 13.9 11.5 - 15.5 %   Platelets 348 150 - 400 K/uL  ABO/Rh Status: None   Collection Time: 07/24/15 12:35 AM  Result Value Ref Range   ABO/RH(D) A POS   Surgical pcr screen  Status: Abnormal   Collection Time: 07/24/15 1:28 AM  Result Value Ref Range   MRSA, PCR NEGATIVE NEGATIVE   Staphylococcus aureus POSITIVE (A) NEGATIVE    Comment:   The Xpert SA Assay (FDA approved for NASAL specimens in patients over 80 years of age), is one component of a comprehensive surveillance program. Test performance has been validated by [facility] for patients greater than or equal to 84 year old. It is not intended to diagnose infection nor to guide or monitor treatment.   Glucose, capillary Status: None   Collection Time: 07/24/15 7:42 AM  Result Value Ref Range   Glucose-Capillary 69 65 - 99 mg/dL       Imaging Results (Last 48 hours)    US Abdomen Limited  07/23/2015 CLINICAL DATA: 39 year old female with right upper quadrant abdominal pain. EXAM: US ABDOMEN LIMITED - RIGHT UPPER QUADRANT COMPARISON: Abdominal ultrasound 07/20/2015. FINDINGS: Gallbladder: Small echogenic foci with distal acoustic shadowing measuring up to 8 mm in diameter, compatible small gallstones. Gallbladder does not appear distended. Gallbladder wall thickness is normal at 2.8 mm. No pericholecystic fluid. Per report from the sonographer, the patient did exhibit a sonographic Murphy's sign on examination. Common bile duct: Diameter: 3.4 mm in the porta hepatis. Liver: No focal lesion identified. Within normal limits in parenchymal echogenicity. IMPRESSION: 1. Study is positive for cholelithiasis. Although there is no distention of the gallbladder, no gallbladder wall thickening and no pericholecystic fluid, per report from the sonographer, the patient did exhibit a positive sonographic Murphy's sign. Findings are overall equivocal, but concerning for potential acute cholecystitis and further clinical correlation is recommended. Electronically Signed  By: Vinnie Langton M.D. On: 07/23/2015 23:15     Review of Systems  Constitutional: Negative.   HENT: Negative.  Eyes: Negative.  Respiratory: Negative.  Cardiovascular: Positive for claudication (1/2 mile).  Gastrointestinal: Positive for heartburn, nausea, vomiting, abdominal pain (Pain is reported midline and RUQ, currently all over.), diarrhea and constipation. Negative for blood in stool.  Genitourinary: Positive for dysuria and frequency.  Musculoskeletal: Negative.  Skin: Negative.  Neurological: Negative.  Endo/Heme/Allergies: Negative.  Psychiatric/Behavioral: Positive for depression. The patient is nervous/anxious.   She has been using marijuana at home to treat pain.   Blood pressure 139/83, pulse 64, temperature 97.7 F (36.5 C), temperature source Oral, resp. rate 20, height _0  (1.626 m), weight 87.635 kg (193 lb 3.2 oz), last menstrual period 07/01/2015, SpO2 100 %. Physical Exam  Constitutional: She is oriented to person, place, and time. She appears well-developed and well-nourished. No distress.  HENT:  Head: Normocephalic and atraumatic.  Nose: Nose normal.  Eyes: Conjunctivae and EOM are normal. Right eye exhibits no discharge. Left eye exhibits no discharge. No scleral icterus.  Neck: Normal range of motion. Neck supple. No JVD present. No tracheal deviation present. No thyromegaly present.  Cardiovascular: Normal rate, regular rhythm, normal heart sounds and intact distal pulses.  No murmur heard. Respiratory: Effort normal and breath sounds normal. No respiratory distress. She has no wheezes. She has no rales. She exhibits no tenderness.  GI: Soft. Bowel sounds are normal. She exhibits no distension. There is tenderness (she is tender any place you touch. She says main pain  is midepigastric and RUQ, but now she is tender all over.).  Musculoskeletal: She exhibits no edema or tenderness.  Lymphadenopathy:   She has no cervical adenopathy.  Neurological: She is alert and oriented to person, place, and time. No cranial nerve deficit.   Skin: Skin is warm and dry. No rash noted. She is not diaphoretic. No erythema. No pallor.  Psychiatric: She has a normal mood and affect. Her behavior is normal. Thought content normal.    Assessment/Plan: Abdominal pain, with nausea, vomiting and mild LFT elevations Cholelithiasis Possible UTI Hx of drug abuse, none for 2 year, MJ use since 07/20/15 for symptom control Hx of tobacco use Possible Arthritis, fingers and shoulder, work up last week at [facility], on prednisone taper.   Hx of tachycardia on BB Hypertension Depression/PTSD Hx of seizures, none since 2013  Plan:  Currently I am not sure we have enough evidence to take her gallbladder based just on Ultrasounds and 2 elevated transaminase findings.  I will discuss with Dr. Marlou Starks and have talked to Dr. Broadus John.  Makaylen Thieme 07/24/2015, 9:33 AM

## 2015-07-25 ENCOUNTER — Encounter (HOSPITAL_COMMUNITY)
Admission: EM | Disposition: A | Discharge status: Home / Self Care | Payer: Self-pay | Source: Home / Self Care | Attending: Internal Medicine

## 2015-07-25 ENCOUNTER — Encounter (HOSPITAL_COMMUNITY): Payer: Self-pay | Admitting: Certified Registered"

## 2015-07-25 ENCOUNTER — Inpatient Hospital Stay (HOSPITAL_COMMUNITY): Payer: Self-pay | Admitting: Anesthesiology

## 2015-07-25 ENCOUNTER — Inpatient Hospital Stay (HOSPITAL_COMMUNITY): Payer: MEDICAID | Admitting: Anesthesiology

## 2015-07-25 ENCOUNTER — Inpatient Hospital Stay (HOSPITAL_COMMUNITY): Payer: Self-pay

## 2015-07-25 HISTORY — PX: CHOLECYSTECTOMY: SHX55

## 2015-07-25 LAB — HEPATITIS PANEL, ACUTE
HCV Ab: <0.1 s/co ratio (ref 0.0–0.9)
HEP A IGM: NEGATIVE
HEP B C IGM: NEGATIVE
HEP B S AG: NEGATIVE

## 2015-07-25 LAB — CBC
HEMATOCRIT: 40.6 % (ref 36.0–46.0)
Hemoglobin: 13.2 g/dL (ref 12.0–15.0)
MCH: 30.5 pg (ref 26.0–34.0)
MCHC: 32.5 g/dL (ref 30.0–36.0)
MCV: 93.8 fL (ref 78.0–100.0)
Platelets: 313 10*3/uL (ref 150–400)
RBC: 4.33 MIL/uL (ref 3.87–5.11)
RDW: 14 % (ref 11.5–15.5)
WBC: 9.6 10*3/uL (ref 4.0–10.5)

## 2015-07-25 LAB — COMPREHENSIVE METABOLIC PANEL
ALBUMIN: 3.6 g/dL (ref 3.5–5.0)
ALK PHOS: 67 U/L (ref 38–126)
ALT: 433 U/L — AB (ref 14–54)
AST: 107 U/L — AB (ref 15–41)
Anion gap: 6 (ref 5–15)
BILIRUBIN TOTAL: 0.8 mg/dL (ref 0.3–1.2)
BUN: 8 mg/dL (ref 6–20)
CO2: 27 mmol/L (ref 22–32)
Calcium: 8.8 mg/dL — ABNORMAL LOW (ref 8.9–10.3)
Chloride: 107 mmol/L (ref 101–111)
Creatinine, Ser: 0.61 mg/dL (ref 0.44–1.00)
GFR calc Af Amer: >60 mL/min (ref >60)
GFR calc non Af Amer: >60 mL/min (ref >60)
GLUCOSE: 82 mg/dL (ref 65–99)
POTASSIUM: 3.9 mmol/L (ref 3.5–5.1)
Sodium: 140 mmol/L (ref 135–145)
TOTAL PROTEIN: 6.7 g/dL (ref 6.5–8.1)

## 2015-07-25 LAB — GLUCOSE, CAPILLARY: Glucose-Capillary: 63 mg/dL — ABNORMAL LOW (ref 65–99)

## 2015-07-25 SURGERY — LAPAROSCOPIC CHOLECYSTECTOMY WITH INTRAOPERATIVE CHOLANGIOGRAM
Anesthesia: General

## 2015-07-25 MED ORDER — LIDOCAINE HCL (CARDIAC) 20 MG/ML IV SOLN
INTRAVENOUS | Status: AC
  Filled 2015-07-25: qty 5

## 2015-07-25 MED ORDER — HYDROMORPHONE HCL 2 MG/ML IJ SOLN
INTRAMUSCULAR | Status: AC
  Filled 2015-07-25: qty 1

## 2015-07-25 MED ORDER — HYDROMORPHONE HCL 1 MG/ML IJ SOLN
INTRAMUSCULAR | Status: AC
  Filled 2015-07-25: qty 1

## 2015-07-25 MED ORDER — LACTATED RINGERS IR SOLN
Status: DC | PRN
Start: 2015-07-25 — End: 2015-07-25
  Administered 2015-07-25: 1000 mL

## 2015-07-25 MED ORDER — LACTATED RINGERS IV SOLN
INTRAVENOUS | Status: DC | PRN
  Administered 2015-07-25 (×2): via INTRAVENOUS

## 2015-07-25 MED ORDER — LIDOCAINE HCL (CARDIAC) 20 MG/ML IV SOLN
INTRAVENOUS | Status: DC | PRN
  Administered 2015-07-25: 50 mg via INTRAVENOUS

## 2015-07-25 MED ORDER — DEXTROSE 5 % IV SOLN
INTRAVENOUS | Status: AC
  Filled 2015-07-25: qty 2

## 2015-07-25 MED ORDER — HYDROMORPHONE HCL 1 MG/ML IJ SOLN
0.2500 mg | INTRAMUSCULAR | Status: DC | PRN
  Administered 2015-07-25 (×4): 0.5 mg via INTRAVENOUS

## 2015-07-25 MED ORDER — MIDAZOLAM HCL 5 MG/5ML IJ SOLN
INTRAMUSCULAR | Status: DC | PRN
  Administered 2015-07-25: 2 mg via INTRAVENOUS

## 2015-07-25 MED ORDER — GLYCOPYRROLATE 0.2 MG/ML IJ SOLN
INTRAMUSCULAR | Status: AC
Start: 2015-07-25 — End: 2015-07-25
  Filled 2015-07-25: qty 3

## 2015-07-25 MED ORDER — DEXTROSE 5 % IV SOLN
2.0000 g | Freq: Once | INTRAVENOUS | Status: AC
  Administered 2015-07-25: 2 g via INTRAVENOUS
  Filled 2015-07-25: qty 2

## 2015-07-25 MED ORDER — NEOSTIGMINE METHYLSULFATE 10 MG/10ML IV SOLN
INTRAVENOUS | Status: DC | PRN
  Administered 2015-07-25: 4 mg via INTRAVENOUS

## 2015-07-25 MED ORDER — ONDANSETRON HCL 4 MG/2ML IJ SOLN
INTRAMUSCULAR | Status: DC | PRN
  Administered 2015-07-25: 4 mg via INTRAVENOUS

## 2015-07-25 MED ORDER — PROMETHAZINE HCL 25 MG/ML IJ SOLN: 6.2500 mg | INTRAMUSCULAR | Status: DC | PRN

## 2015-07-25 MED ORDER — SUCCINYLCHOLINE CHLORIDE 20 MG/ML IJ SOLN
INTRAMUSCULAR | Status: DC | PRN
  Administered 2015-07-25: 100 mg via INTRAVENOUS

## 2015-07-25 MED ORDER — PROPOFOL 10 MG/ML IV BOLUS
INTRAVENOUS | Status: DC | PRN
  Administered 2015-07-25: 180 mg via INTRAVENOUS

## 2015-07-25 MED ORDER — ROCURONIUM BROMIDE 100 MG/10ML IV SOLN
INTRAVENOUS | Status: AC
  Filled 2015-07-25: qty 1

## 2015-07-25 MED ORDER — GLYCOPYRROLATE 0.2 MG/ML IJ SOLN
INTRAMUSCULAR | Status: DC | PRN
  Administered 2015-07-25: 0.6 mg via INTRAVENOUS

## 2015-07-25 MED ORDER — ROCURONIUM BROMIDE 100 MG/10ML IV SOLN
INTRAVENOUS | Status: DC | PRN
  Administered 2015-07-25: 10 mg via INTRAVENOUS
  Administered 2015-07-25: 25 mg via INTRAVENOUS
  Administered 2015-07-25: 10 mg via INTRAVENOUS

## 2015-07-25 MED ORDER — IOHEXOL 300 MG/ML  SOLN
INTRAMUSCULAR | Status: DC | PRN
  Administered 2015-07-25: 4 mL

## 2015-07-25 MED ORDER — FENTANYL CITRATE (PF) 250 MCG/5ML IJ SOLN
INTRAMUSCULAR | Status: AC
  Filled 2015-07-25: qty 25

## 2015-07-25 MED ORDER — FENTANYL CITRATE (PF) 100 MCG/2ML IJ SOLN
INTRAMUSCULAR | Status: DC | PRN
  Administered 2015-07-25: 50 ug via INTRAVENOUS
  Administered 2015-07-25: 100 ug via INTRAVENOUS
  Administered 2015-07-25 (×2): 50 ug via INTRAVENOUS

## 2015-07-25 MED ORDER — HYDROMORPHONE HCL 1 MG/ML IJ SOLN
INTRAMUSCULAR | Status: DC | PRN
  Administered 2015-07-25 (×2): 0.5 mg via INTRAVENOUS
  Administered 2015-07-25: 1 mg via INTRAVENOUS

## 2015-07-25 MED ORDER — NEOSTIGMINE METHYLSULFATE 10 MG/10ML IV SOLN
INTRAVENOUS | Status: AC
  Filled 2015-07-25: qty 1

## 2015-07-25 MED ORDER — BUPIVACAINE-EPINEPHRINE 0.25% -1:200000 IJ SOLN
INTRAMUSCULAR | Status: DC | PRN
  Administered 2015-07-25: 7 mL

## 2015-07-25 MED ORDER — PROPOFOL 10 MG/ML IV BOLUS
INTRAVENOUS | Status: AC
  Filled 2015-07-25: qty 20

## 2015-07-25 MED ORDER — LACTATED RINGERS IV SOLN: INTRAVENOUS | Status: DC

## 2015-07-25 MED ORDER — BUPIVACAINE-EPINEPHRINE 0.25% -1:200000 IJ SOLN
INTRAMUSCULAR | Status: AC
  Filled 2015-07-25: qty 1

## 2015-07-25 MED ORDER — MIDAZOLAM HCL 2 MG/2ML IJ SOLN
INTRAMUSCULAR | Status: AC
  Filled 2015-07-25: qty 4

## 2015-07-25 SURGICAL SUPPLY — 31 items
APPLIER CLIP 5 13 M/L LIGAMAX5 (MISCELLANEOUS) ×3
APR CLP MED LRG 5 ANG JAW (MISCELLANEOUS) ×1
BAG SPEC RTRVL LRG 6X4 10 (ENDOMECHANICALS)
CATH REDDICK CHOLANGI 4FR 50CM (CATHETERS) ×3 IMPLANT
CHLORAPREP W/TINT 26ML (MISCELLANEOUS) ×3 IMPLANT
CLIP APPLIE 5 13 M/L LIGAMAX5 (MISCELLANEOUS) ×1 IMPLANT
COVER MAYO STAND STRL (DRAPES) ×3 IMPLANT
COVER SURGICAL LIGHT HANDLE (MISCELLANEOUS) ×3 IMPLANT
DECANTER SPIKE VIAL GLASS SM (MISCELLANEOUS) ×3 IMPLANT
DRAPE C-ARM 42X120 X-RAY (DRAPES) ×3 IMPLANT
DRAPE LAPAROSCOPIC ABDOMINAL (DRAPES) ×3 IMPLANT
ELECT REM PT RETURN 9FT ADLT (ELECTROSURGICAL) ×3
ELECTRODE REM PT RTRN 9FT ADLT (ELECTROSURGICAL) ×1 IMPLANT
GLOVE BIO SURGEON STRL SZ7.5 (GLOVE) ×6 IMPLANT
GOWN STRL REUS W/ TWL XL LVL3 (GOWN DISPOSABLE) IMPLANT
GOWN STRL REUS W/TWL LRG LVL3 (GOWN DISPOSABLE) ×3 IMPLANT
GOWN STRL REUS W/TWL XL LVL3 (GOWN DISPOSABLE) ×3 IMPLANT
HEMOSTAT SURGICEL 4X8 (HEMOSTASIS) IMPLANT
IV CATH 14GX2 1/4 (CATHETERS) ×3 IMPLANT
KIT BASIN OR (CUSTOM PROCEDURE TRAY) ×3 IMPLANT
LIQUID BAND (GAUZE/BANDAGES/DRESSINGS) ×3 IMPLANT
POUCH SPECIMEN RETRIEVAL 10MM (ENDOMECHANICALS) IMPLANT
SCISSORS ENDO CVD 5DCS (MISCELLANEOUS) ×2 IMPLANT
SET IRRIG TUBING LAPAROSCOPIC (IRRIGATION / IRRIGATOR) ×3 IMPLANT
SLEEVE XCEL OPT CAN 5 100 (ENDOMECHANICALS) ×6 IMPLANT
SUT MNCRL AB 4-0 PS2 18 (SUTURE) ×3 IMPLANT
TOWEL OR 17X26 10 PK STRL BLUE (TOWEL DISPOSABLE) ×3 IMPLANT
TOWEL OR NON WOVEN STRL DISP B (DISPOSABLE) ×3 IMPLANT
TRAY LAPAROSCOPIC (CUSTOM PROCEDURE TRAY) ×3 IMPLANT
TROCAR BLADELESS OPT 5 100 (ENDOMECHANICALS) ×3 IMPLANT
TROCAR XCEL BLUNT TIP 100MML (ENDOMECHANICALS) ×3 IMPLANT

## 2015-07-25 NOTE — Anesthesia Preprocedure Evaluation (Addendum)
Anesthesia Evaluation  Patient identified by MRN, date of birth, ID band Patient awake    Reviewed: Allergy & Precautions, H&P , NPO status , Patient's Chart, lab work & pertinent test results, reviewed documented beta blocker date and time   History of Anesthesia Complications Negative for: history of anesthetic complications  Airway Mallampati: II  TM Distance: >3 FB Neck ROM: full    Dental no notable dental hx.    Pulmonary neg pulmonary ROS, former smoker,    Pulmonary exam normal breath sounds clear to auscultation       Cardiovascular hypertension, Pt. on medications Normal cardiovascular exam Rhythm:regular Rate:Normal     Neuro/Psych Seizures -, Well Controlled,  Depression PTSD   GI/Hepatic negative GI ROS, Neg liver ROS,   Endo/Other  negative endocrine ROS  Renal/GU negative Renal ROS     Musculoskeletal   Abdominal (+) + obese,   Peds  Hematology negative hematology ROS (+)   Anesthesia Other Findings Last seizure 3 years ago, maintenance medication given on schedule  Prednisone given today, no need to stress dose  Reproductive/Obstetrics negative OB ROS                           Anesthesia Physical Anesthesia Plan  ASA: III  Anesthesia Plan: General   Post-op Pain Management:    Induction: Intravenous  Airway Management Planned: Oral ETT  Additional Equipment:   Intra-op Plan:   Post-operative Plan: Extubation in OR  Informed Consent: I have reviewed the patients History and Physical, chart, labs and discussed the procedure including the risks, benefits and alternatives for the proposed anesthesia with the patient or authorized representative who has indicated his/her understanding and acceptance.   Dental Advisory Given  Plan Discussed with: Anesthesiologist, CRNA and Surgeon  Anesthesia Plan Comments:         Anesthesia Quick Evaluation

## 2015-07-25 NOTE — Progress Notes (Signed)
Day of Surgery  Subjective: She is complaining of pain less this AM , points to mid epigastric site.    Objective: Vital signs in last 24 hours: Temp:  [98.1 F (36.7 C)-98.9 F (37.2 C)] 98.2 F (36.8 C) (10/25 0511) Pulse Rate:  [66-68] 68 (10/25 0511) Resp:  [16-20] 16 (10/25 0511) BP: (121-130)/(56-83) 128/68 mmHg (10/25 0511) SpO2:  [98 %-100 %] 99 % (10/25 0511) Last BM Date: 07/23/15 120 PO NPO Afebrile, VSS Labs OK LFT's improving CT scan:  No evidence of bowel obstruction. Normal appendix. Layering tiny gallstones, without evidence acute cholecystitis.  Moderate left colonic stool burden. Intake/Output from previous day: 10/24 0701 - 10/25 0700 In: 1205 [P.O.:120; I.V.:1085] Out: 1200 [Urine:1200] Intake/Output this shift: Total I/O In: 120 [P.O.:120] Out: -   General appearance: alert, cooperative and no distress GI: soft, some tenderness mid epigastric site.    Lab Results:   Recent Labs  07/24/15 0035 07/25/15 0425  WBC 10.3 9.6  HGB 12.6 13.2  HCT 37.5 40.6  PLT 348 313    BMET  Recent Labs  07/24/15 0035 07/25/15 0425  NA 137 140  K 3.7 3.9  CL 108 107  CO2 23 27  GLUCOSE 86 82  BUN 12 8  CREATININE 0.49 0.61  CALCIUM 8.7* 8.8*   PT/INR  Recent Labs  07/24/15 0035  LABPROT 14.5  INR 1.11     Recent Labs Lab 07/20/15 1900 07/23/15 2000 07/24/15 0035 07/25/15 0425  AST 30 286* 197* 107*  ALT 29 799* 623* 433*  ALKPHOS 48 87 76 67  BILITOT 0.3 1.0 0.8 0.8  PROT 7.2 8.1 7.0 6.7  ALBUMIN 3.9 4.0 3.7 3.6     Lipase     Component Value Date/Time   LIPASE 20 07/23/2015 2000     Studies/Results: Ct Abdomen Pelvis W Contrast  07/24/2015  CLINICAL DATA:  Abdominal pain, nausea/vomiting, gallstones on ultrasound EXAM: CT ABDOMEN AND PELVIS WITH CONTRAST TECHNIQUE: Multidetector CT imaging of the abdomen and pelvis was performed using the standard protocol following bolus administration of intravenous contrast. CONTRAST:   100mL OMNIPAQUE IOHEXOL 300 MG/ML  SOLN COMPARISON:  Right upper quadrant ultrasound dated 07/23/2015 FINDINGS: Lower chest:  Lung bases are clear. Hepatobiliary: Liver is within normal limits. No suspicious/enhancing hepatic lesions. Layering tiny gallstones (series 2/ image 21), without associated inflammatory changes. No intrahepatic extrahepatic ductal dilatation. Pancreas: Within normal limits. Spleen: Within normal limits. Adrenals/Urinary Tract: Adrenal glands are within normal limits. Kidneys are within normal limits.  No hydronephrosis. Bladder is within normal limits. Stomach/Bowel: Stomach is within normal limits. No evidence of bowel obstruction. Normal appendix (series 2/ image 58). Moderate left colonic stool burden. Vascular/Lymphatic: No evidence of abdominal aortic aneurysm. No suspicious abdominopelvic lymphadenopathy. Reproductive: Uterus is within normal limits. Bilateral ovaries are unremarkable, noting a 1.5 cm right corpus luteal cyst. Other: Trace pelvic ascites, likely physiologic. Musculoskeletal: Visualized osseous structures are within normal limits. IMPRESSION: No evidence of bowel obstruction.  Normal appendix. Layering tiny gallstones, without evidence acute cholecystitis. Moderate left colonic stool burden. Electronically Signed   By: Charline BillsSriyesh  Krishnan M.D.   On: 07/24/2015 19:30   [place] Abdomen Limited  07/23/2015  CLINICAL DATA:  39 year old female with right upper quadrant abdominal pain. EXAM: US ABDOMEN LIMITED - RIGHT UPPER QUADRANT COMPARISON:  Abdominal ultrasound 07/20/2015. FINDINGS: Gallbladder: Small echogenic foci with distal acoustic shadowing measuring up to 8 mm in diameter, compatible small gallstones. Gallbladder does not appear distended. Gallbladder wall thickness is normal  at 2.8 mm. No pericholecystic fluid. Per report from the sonographer, the patient did exhibit a sonographic Murphy's sign on examination. Common bile duct: Diameter: 3.4 mm in the porta  hepatis. Liver: No focal lesion identified. Within normal limits in parenchymal echogenicity. IMPRESSION: 1. Study is positive for cholelithiasis. Although there is no distention of the gallbladder, no gallbladder wall thickening and no pericholecystic fluid, per report from the sonographer, the patient did exhibit a positive sonographic Murphy's sign. Findings are overall equivocal, but concerning for potential acute cholecystitis and further clinical correlation is recommended. Electronically Signed   By: Trudie Reed M.D.   On: 07/23/2015 23:15    Medications: . acidophilus  1 capsule Oral Daily  . busPIRone  30 mg Oral BID  . cefTRIAXone (ROCEPHIN)  IV  1 g Intravenous Q24H  . metoprolol tartrate  25 mg Oral BID  . pantoprazole  40 mg Oral Q1200  . PHENobarbital  97.2 mg Oral Daily  . predniSONE  20 mg Oral Q breakfast  . sertraline  150 mg Oral Daily    Assessment/Plan Cholelithiasis Possible UTI Hx of drug abuse, none for 2 year, MJ use since 07/20/15 for symptom control Hx of tobacco use Possible Arthritis, fingers and shoulder, work up last week at [place], on prednisone taper.  Hx of tachycardia on BB Hypertension Depression/PTSD Hx of seizures, none since 2013 Antibiotics: rocephin on call   Plan:  Elective cholecystectomy today.     LOS: 2 days    Willett Lefeber 07/25/2015

## 2015-07-25 NOTE — H&P (View-Only) (Signed)
Reason for Consult:  Symptomatic gallstone Referring Physician:  Dr. [name] is an 39 y.o. female.  HPI: 39 y/o admitted last PM with abdominal pain, nausea and vomiting. She was seen in the ED 07/20/15 at [facility] with the same issue. She had a abdominal US with Gallstones. No GB wall thickening, CBD 5 mm. She was to follow up with surgery on 08/03/15.  She returns now with worsening pain mid and RUQ portions of the abdomen, nausea and vomiting. Pain is not associated with PO intake. Pain is all over, but currently mostly in the mid epigastric area, and RUQ. Work up in the ED shows she is afebrile, and VSS. She has labs at 8 PM and 1235 AM that shows AST and ALT are up, but improved. Repeat US last PM shows cholelithiasis up to 8 mm, GB, is not distened, GB wall is not thickened, CBD is 3.4 mm. She has issues with nausea and vomiting yesterday, but has not vomited here. She says pain is all over 24/7, nothing is making it better or worse.  Past Medical History  Diagnosis Date   Hx of drug abuse, clean for 2 years prior to this    Possible arthritis on steroids for finger and shoulder pain Started last week by [facility]   Hx of tachycardia - On Lopressor for this.   . Hypertension   . Seizures ([employer]) None since 2013   . Depression   . PTSD (post-traumatic stress disorder) Recently diagnosed   . Palpitations     Past Surgical History  Procedure Laterality Date  . Dilation and curettage of uterus    . Induced abortion    . Facial cosmetic surgery      No family history on file.  Social History:  reports that she has quit smoking. Her smoking use included Cigarettes. She smoked 0.50 packs per day. She does not have any smokeless tobacco history on file. She reports that she drinks alcohol. She reports that she does not use illicit drugs. Tobacco: 2 PPD, quit 2 years ago, 24 year history ETOH: Social Drugs: Has  been using Marijuana for pain control since discharge 07/20/15.   Allergies:  Allergies  Allergen Reactions  . Dilantin [Phenytoin Sodium Extended]     hives  . Imitrex [Sumatriptan] Other (See Comments)    Fatigue    Medications:  Prior to Admission:  Prescriptions prior to admission  Medication Sig Dispense Refill Last Dose  . busPIRone (BUSPAR) 30 MG tablet Take 30 mg by mouth 2 (two) times daily.   07/23/2015 at Unknown time  . metoprolol tartrate (LOPRESSOR) 25 MG tablet Take 25 mg by mouth 2 (two) times daily.  0 07/23/2015 at Unknown time  . oxyCODONE-acetaminophen (PERCOCET) 5-325 MG tablet Take 1-2 tablets by mouth every 6 (six) hours as needed. (Patient taking differently: Take 1-2 tablets by mouth every 6 (six) hours as needed for moderate pain or severe pain. ) 20 tablet 0 07/23/2015 at 1830  . PHENobarbital (LUMINAL) 97.2 MG tablet Take 97.2 mg by mouth daily.  1 07/22/2015 at Unknown time  . predniSONE (DELTASONE) 20 MG tablet Take 20 mg by mouth See admin instructions. 6 day dose pack started 10/18   07/23/2015 at Unknown time  . Probiotic Product (PROBIOTIC PO) Take 1 tablet by mouth daily.   2 weeks  . sertraline (ZOLOFT) 100 MG tablet Take 150 mg by mouth daily.   07/23/2015 at Unknown time  . TURMERIC PO Take  400 mg by mouth daily.   2 weeks   Scheduled: . acidophilus 1 capsule Oral Daily  . busPIRone 30 mg Oral BID  . metoprolol tartrate 25 mg Oral BID  . pantoprazole 40 mg Oral Q1200  . PHENobarbital 97.2 mg Oral Daily  . piperacillin-tazobactam (ZOSYN) IV 3.375 g Intravenous 3 times per day  . predniSONE 20 mg Oral Q breakfast  . sertraline 150 mg Oral Daily   Continuous:  JAS:NKNLZJQB injection, ondansetron (ZOFRAN) IV, oxyCODONE-acetaminophen Anti-infectives    Start   Dose/Rate Route Frequency Ordered Stop   07/24/15  0000  cefTRIAXone (ROCEPHIN) 1 g in dextrose 5 % 50 mL IVPB Status: Discontinued    1 g 100 mL/hr over 30 Minutes Intravenous Once 07/23/15 2345 07/23/15 2357   07/24/15 0000  piperacillin-tazobactam (ZOSYN) IVPB 3.375 g    3.375 g 12.5 mL/hr over 240 Minutes Intravenous 3 times per day 07/23/15 2357        Lab Results Last 48 Hours    Results for orders placed or performed during the hospital encounter of 07/23/15 (from the past 48 hour(s))  CBC with Differential Status: None   Collection Time: 07/23/15 8:00 PM  Result Value Ref Range   WBC 10.2 4.0 - 10.5 K/uL   RBC 4.42 3.87 - 5.11 MIL/uL   Hemoglobin 13.6 12.0 - 15.0 g/dL   HCT 40.1 36.0 - 46.0 %   MCV 90.7 78.0 - 100.0 fL   MCH 30.8 26.0 - 34.0 pg   MCHC 33.9 30.0 - 36.0 g/dL   RDW 13.7 11.5 - 15.5 %   Platelets 370 150 - 400 K/uL   Neutrophils Relative % 74 %   Neutro Abs 7.5 1.7 - 7.7 K/uL   Lymphocytes Relative 21 %   Lymphs Abs 2.2 0.7 - 4.0 K/uL   Monocytes Relative 5 %   Monocytes Absolute 0.5 0.1 - 1.0 K/uL   Eosinophils Relative 0 %   Eosinophils Absolute 0.0 0.0 - 0.7 K/uL   Basophils Relative 0 %   Basophils Absolute 0.0 0.0 - 0.1 K/uL  Lipase, blood Status: None   Collection Time: 07/23/15 8:00 PM  Result Value Ref Range   Lipase 20 11 - 51 U/L    Comment: Please note change in reference range.  Comprehensive metabolic panel Status: Abnormal   Collection Time: 07/23/15 8:00 PM  Result Value Ref Range   Sodium 134 (L) 135 - 145 mmol/L   Potassium 3.7 3.5 - 5.1 mmol/L   Chloride 103 101 - 111 mmol/L   CO2 22 22 - 32 mmol/L   Glucose, Bld 147 (H) 65 - 99 mg/dL   BUN 13 6 - 20 mg/dL   Creatinine, Ser 0.58 0.44 - 1.00 mg/dL   Calcium 9.2 8.9 - 10.3 mg/dL   Total Protein 8.1 6.5 - 8.1 g/dL   Albumin 4.0 3.5 - 5.0 g/dL    AST 286 (H) 15 - 41 U/L   ALT 799 (H) 14 - 54 U/L   Alkaline Phosphatase 87 38 - 126 U/L   Total Bilirubin 1.0 0.3 - 1.2 mg/dL   GFR calc non Af Amer >60 >60 mL/min   GFR calc Af Amer >60 >60 mL/min    Comment: (NOTE) The eGFR has been calculated using the CKD EPI equation. This calculation has not been validated in all clinical situations. eGFR's persistently <60 mL/min signify possible Chronic Kidney Disease.    Anion gap 9 5 - 15  Urinalysis, Routine w reflex microscopic Status:  Abnormal   Collection Time: 07/23/15 8:17 PM  Result Value Ref Range   Color, Urine YELLOW YELLOW   APPearance CLOUDY (A) CLEAR   Specific Gravity, Urine 1.025 1.005 - 1.030   pH 6.0 5.0 - 8.0   Glucose, UA NEGATIVE NEGATIVE mg/dL   Hgb urine dipstick NEGATIVE NEGATIVE   Bilirubin Urine NEGATIVE NEGATIVE   Ketones, ur NEGATIVE NEGATIVE mg/dL   Protein, ur NEGATIVE NEGATIVE mg/dL   Urobilinogen, UA 1.0 0.0 - 1.0 mg/dL   Nitrite POSITIVE (A) NEGATIVE   Leukocytes, UA SMALL (A) NEGATIVE  Urine microscopic-add on Status: Abnormal   Collection Time: 07/23/15 8:17 PM  Result Value Ref Range   WBC, UA 3-6 <3 WBC/hpf   RBC / HPF 0-2 <3 RBC/hpf   Bacteria, UA MANY (A) RARE   Crystals CA OXALATE CRYSTALS (A) NEGATIVE  hCG, quantitative, pregnancy Status: None   Collection Time: 07/24/15 12:35 AM  Result Value Ref Range   hCG, Beta Chain, Quant, S <1 <5 mIU/mL    Comment:    GEST. AGE CONC. (mIU/mL)  <=1 WEEK 5 - 50  2 WEEKS 50 - 500  3 WEEKS 100 - 10,000  4 WEEKS 1,000 - 30,000  5 WEEKS 3,500 - 115,000  6-8 WEEKS 12,000 - 270,000  12 WEEKS 15,000 - 220,000   FEMALE AND NON-PREGNANT FEMALE:  LESS THAN 5 mIU/mL   Protime-INR Status: None   Collection Time: 07/24/15 12:35 AM  Result  Value Ref Range   Prothrombin Time 14.5 11.6 - 15.2 seconds   INR 1.11 0.00 - 1.49  APTT Status: None   Collection Time: 07/24/15 12:35 AM  Result Value Ref Range   aPTT 28 24 - 37 seconds  Type and screen [place] Status: None   Collection Time: 07/24/15 12:35 AM  Result Value Ref Range   ABO/RH(D) A POS    Antibody Screen NEG    Sample Expiration 07/27/2015   Comprehensive metabolic panel Status: Abnormal   Collection Time: 07/24/15 12:35 AM  Result Value Ref Range   Sodium 137 135 - 145 mmol/L   Potassium 3.7 3.5 - 5.1 mmol/L   Chloride 108 101 - 111 mmol/L   CO2 23 22 - 32 mmol/L   Glucose, Bld 86 65 - 99 mg/dL   BUN 12 6 - 20 mg/dL   Creatinine, Ser 0.49 0.44 - 1.00 mg/dL   Calcium 8.7 (L) 8.9 - 10.3 mg/dL   Total Protein 7.0 6.5 - 8.1 g/dL   Albumin 3.7 3.5 - 5.0 g/dL   AST 197 (H) 15 - 41 U/L   ALT 623 (H) 14 - 54 U/L   Alkaline Phosphatase 76 38 - 126 U/L   Total Bilirubin 0.8 0.3 - 1.2 mg/dL   GFR calc non Af Amer >60 >60 mL/min   GFR calc Af Amer >60 >60 mL/min    Comment: (NOTE) The eGFR has been calculated using the CKD EPI equation. This calculation has not been validated in all clinical situations. eGFR's persistently <60 mL/min signify possible Chronic Kidney Disease.    Anion gap 6 5 - 15  CBC Status: None   Collection Time: 07/24/15 12:35 AM  Result Value Ref Range   WBC 10.3 4.0 - 10.5 K/uL   RBC 4.08 3.87 - 5.11 MIL/uL   Hemoglobin 12.6 12.0 - 15.0 g/dL   HCT 37.5 36.0 - 46.0 %   MCV 91.9 78.0 - 100.0 fL   MCH 30.9 26.0 - 34.0 pg  MCHC 33.6 30.0 - 36.0 g/dL   RDW 13.9 11.5 - 15.5 %   Platelets 348 150 - 400 K/uL  ABO/Rh Status: None   Collection Time: 07/24/15 12:35 AM  Result Value Ref Range   ABO/RH(D) A POS   Surgical pcr screen  Status: Abnormal   Collection Time: 07/24/15 1:28 AM  Result Value Ref Range   MRSA, PCR NEGATIVE NEGATIVE   Staphylococcus aureus POSITIVE (A) NEGATIVE    Comment:   The Xpert SA Assay (FDA approved for NASAL specimens in patients over 80 years of age), is one component of a comprehensive surveillance program. Test performance has been validated by [facility] for patients greater than or equal to 84 year old. It is not intended to diagnose infection nor to guide or monitor treatment.   Glucose, capillary Status: None   Collection Time: 07/24/15 7:42 AM  Result Value Ref Range   Glucose-Capillary 69 65 - 99 mg/dL       Imaging Results (Last 48 hours)    US Abdomen Limited  07/23/2015 CLINICAL DATA: 39 year old female with right upper quadrant abdominal pain. EXAM: US ABDOMEN LIMITED - RIGHT UPPER QUADRANT COMPARISON: Abdominal ultrasound 07/20/2015. FINDINGS: Gallbladder: Small echogenic foci with distal acoustic shadowing measuring up to 8 mm in diameter, compatible small gallstones. Gallbladder does not appear distended. Gallbladder wall thickness is normal at 2.8 mm. No pericholecystic fluid. Per report from the sonographer, the patient did exhibit a sonographic Murphy's sign on examination. Common bile duct: Diameter: 3.4 mm in the porta hepatis. Liver: No focal lesion identified. Within normal limits in parenchymal echogenicity. IMPRESSION: 1. Study is positive for cholelithiasis. Although there is no distention of the gallbladder, no gallbladder wall thickening and no pericholecystic fluid, per report from the sonographer, the patient did exhibit a positive sonographic Murphy's sign. Findings are overall equivocal, but concerning for potential acute cholecystitis and further clinical correlation is recommended. Electronically Signed  By: Vinnie Langton M.D. On: 07/23/2015 23:15     Review of Systems  Constitutional: Negative.   HENT: Negative.  Eyes: Negative.  Respiratory: Negative.  Cardiovascular: Positive for claudication (1/2 mile).  Gastrointestinal: Positive for heartburn, nausea, vomiting, abdominal pain (Pain is reported midline and RUQ, currently all over.), diarrhea and constipation. Negative for blood in stool.  Genitourinary: Positive for dysuria and frequency.  Musculoskeletal: Negative.  Skin: Negative.  Neurological: Negative.  Endo/Heme/Allergies: Negative.  Psychiatric/Behavioral: Positive for depression. The patient is nervous/anxious.   She has been using marijuana at home to treat pain.   Blood pressure 139/83, pulse 64, temperature 97.7 F (36.5 C), temperature source Oral, resp. rate 20, height _0  (1.626 m), weight 87.635 kg (193 lb 3.2 oz), last menstrual period 07/01/2015, SpO2 100 %. Physical Exam  Constitutional: She is oriented to person, place, and time. She appears well-developed and well-nourished. No distress.  HENT:  Head: Normocephalic and atraumatic.  Nose: Nose normal.  Eyes: Conjunctivae and EOM are normal. Right eye exhibits no discharge. Left eye exhibits no discharge. No scleral icterus.  Neck: Normal range of motion. Neck supple. No JVD present. No tracheal deviation present. No thyromegaly present.  Cardiovascular: Normal rate, regular rhythm, normal heart sounds and intact distal pulses.  No murmur heard. Respiratory: Effort normal and breath sounds normal. No respiratory distress. She has no wheezes. She has no rales. She exhibits no tenderness.  GI: Soft. Bowel sounds are normal. She exhibits no distension. There is tenderness (she is tender any place you touch. She says main pain  is midepigastric and RUQ, but now she is tender all over.).  Musculoskeletal: She exhibits no edema or tenderness.  Lymphadenopathy:   She has no cervical adenopathy.  Neurological: She is alert and oriented to person, place, and time. No cranial nerve deficit.   Skin: Skin is warm and dry. No rash noted. She is not diaphoretic. No erythema. No pallor.  Psychiatric: She has a normal mood and affect. Her behavior is normal. Thought content normal.    Assessment/Plan: Abdominal pain, with nausea, vomiting and mild LFT elevations Cholelithiasis Possible UTI Hx of drug abuse, none for 2 year, MJ use since 07/20/15 for symptom control Hx of tobacco use Possible Arthritis, fingers and shoulder, work up last week at [facility], on prednisone taper.   Hx of tachycardia on BB Hypertension Depression/PTSD Hx of seizures, none since 2013  Plan:  Currently I am not sure we have enough evidence to take her gallbladder based just on Ultrasounds and 2 elevated transaminase findings.  I will discuss with Dr. Marlou Starks and have talked to Dr. Broadus John.  Juliauna Stueve 07/24/2015, 9:33 AM

## 2015-07-25 NOTE — Op Note (Signed)
07/23/2015 - 07/25/2015  11:57 AM  PATIENT:  Brooke Avila  39 y.o. female  PRE-OPERATIVE DIAGNOSIS:  GALLSTONES  POST-OPERATIVE DIAGNOSIS:  GALLSTONES  PROCEDURE:  Procedure(s): LAPAROSCOPIC CHOLECYSTECTOMY WITH INTRAOPERATIVE CHOLANGIOGRAM (N/A)  SURGEON:  Surgeon(s) and Role:    * Griselda MinerPaul Toth III, MD - Primary  PHYSICIAN ASSISTANT:   ASSISTANTS: none   ANESTHESIA:   general  EBL:  Total I/O In: 1120 [P.O.:120; I.V.:1000] Out: -   BLOOD ADMINISTERED:none  DRAINS: none   LOCAL MEDICATIONS USED:  MARCAINE     SPECIMEN:  Source of Specimen:  gallbladder  DISPOSITION OF SPECIMEN:  PATHOLOGY  COUNTS:  YES  TOURNIQUET:  * No tourniquets in log *  DICTATION: .Dragon Dictation   @opnoteheader @  Procedure: After informed consent was obtained the patient was brought to the operating room and placed in the supine position on the operating room table. After adequate induction of general anesthesia the patient's abdomen was prepped with ChloraPrep allowed to dry and draped in usual sterile manner. The area below the umbilicus was infiltrated with quarter percent  Marcaine. A small incision was made with a 15 blade knife. The incision was carried down through the subcutaneous tissue bluntly with a hemostat and [employer] retractors. The linea alba was identified. The linea alba was incised with a 15 blade knife and each side was grasped with Coker clamps. The preperitoneal space was then probed with a hemostat until the peritoneum was opened and access was gained to the abdominal cavity. A 0 Vicryl pursestring stitch was placed in the fascia surrounding the opening. A Hassan cannula was then placed through the opening and anchored in place with the previously placed Vicryl purse string stitch. The abdomen was insufflated with carbon dioxide without difficulty. A laparoscope was [facility] the [facility]ula in the right upper quadrant was inspected. Next the epigastric region was  infiltrated with % Marcaine. A small incision was made with a 15 blade knife. A 5 mm port was placed bluntly through this incision into the abdominal cavity under direct vision. Next 2 sites were chosen laterally on the right side of the abdomen for placement of 5 mm ports. Each of these areas was infiltrated with quarter percent Marcaine. Small stab incisions were made with a 15 blade knife. 5 mm ports were then placed bluntly through these incisions into the abdominal cavity under direct vision without difficulty. A blunt grasper was placed through the lateralmost 5 mm port and used to grasp the dome of the gallbladder and elevated anteriorly and superiorly. Another blunt grasper was placed through the other 5 mm port and used to retract the body and neck of the gallbladder. A dissector was placed through the epigastric port and using the electrocautery the peritoneal reflection at the gallbladder neck was opened. Blunt dissection was then carried out in this area until the gallbladder neck-cystic duct junction was readily identified and a good window was created. A single clip was placed on the gallbladder neck. A small  ductotomy was made just below the clip with laparoscopic scissors. A 14-gauge Angiocath was then placed through the anterior abdominal wall under direct vision. A Reddick cholangiogram catheter was then placed through the Angiocath and flushed. The catheter was then placed in the cystic duct and anchored in place with a clip. A cholangiogram was obtained that showed no filling defects good emptying into the duodenum an adequate length on the cystic duct. The anchoring clip and catheters were then removed from the patient. 3  clips were placed proximally on the cystic duct and the duct was divided between the 2 sets of clips. Posterior to this the cystic artery was identified and again dissected bluntly in a circumferential manner until a good window  was created. 2 clips were placed proximally  and one distally on the artery and the artery was divided between the 2 sets of clips. Next a laparoscopic hook cautery device was used to separate the gallbladder from the liver bed. Prior to completely detaching the gallbladder from the liver bed the liver bed was inspected and several small bleeding points were coagulated with the electrocautery until the area was completely hemostatic. The gallbladder was then detached the rest of it from the liver bed without difficulty. A laparoscopic bag was inserted through the hassan port. The laparoscope was moved to the epigastric port. The gallbladder was placed within the bag and the bag was sealed.  The bag with the gallbladder was then removed with the [facility] cannula through the infraumbilical port without difficulty. The fascial defect was then closed with the previously placed Vicryl pursestring stitch as well as with another figure-of-eight 0 Vicryl stitch. The liver bed was inspected again and found to be hemostatic. The abdomen was irrigated with copious amounts of saline until the effluent was clear. The ports were then removed under direct vision without difficulty and were found to be hemostatic. The gas was allowed to escape. The skin incisions were all closed with interrupted 4-0 Monocryl subcuticular stitches. Dermabond dressings were applied. The patient tolerated the procedure well. At the end of the case all needle sponge and instrument counts were correct. The patient was then awakened and taken to recovery in stable condition  PLAN OF CARE: Admit for overnight observation  PATIENT DISPOSITION:  PACU - hemodynamically stable.   Delay start of Pharmacological VTE agent (>24hrs) due to surgical blood loss or risk of bleeding: no

## 2015-07-25 NOTE — Progress Notes (Signed)
CSW consulted for financial assistance. Pt presently out of rm for procedure. CSW will request RNCM assist with medications, at d/c, since pt has no insurance.   Cori RazorJamie Sahira Cataldi LCSW [phone]

## 2015-07-25 NOTE — Care Management Note (Signed)
Case Management Note  Patient Details  Name: Brooke Avila MRN: [id number] Date of Birth: November 05, 1975  Subjective/Objective:                   GALLSTONES Action/Plan: Discharge planning  Expected Discharge Date:  07/26/15               Expected Discharge Plan:  Home/Self Care  In-House Referral:     Discharge planning Services  CM Consult, Medication Assistance, [name]  Post Acute Care Choice:    Choice offered to:  Patient  DME Arranged:    DME Agency:     HH Arranged:    [place] Agency:     Status of Service:     Medicare Important Message Given:    Date Medicare IM Given:    Medicare IM give by:    Date Additional Medicare IM Given:    Additional Medicare Important Message give by:     If discussed at [employer] of Stay Meetings, dates discussed:    Additional Comments: CM met with pt and spouse in room and gave pt [facility] pamphlet and pt states she will go to clinic any weekday morning from 9-10 and ask for AN APPOINTMENT TO SECURE A PCP; AN APPOINTMENT WITH A NAVIGATOR FOR INSURANCE; AN APPOINTMENT FOR FOLLOW UP MEDICAL CARE.  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  CM encouraged them to reconsider and meet with a Navigator to pursue insurance.  Pt will most probably only have a prescription for pain mgmt which is not covered by [facility].  NO other CM needs were communicated. Dellie Catholic, RN 07/25/2015, 2:44 PM

## 2015-07-25 NOTE — Interval H&P Note (Signed)
History and Physical Interval Note:  07/25/2015 9:38 AM  Brooke Avila  has presented today for surgery, with the diagnosis of GALLSTONES  The various methods of treatment have been discussed with the patient and family. After consideration of risks, benefits and other options for treatment, the patient has consented to  Procedure(s): LAPAROSCOPIC CHOLECYSTECTOMY WITH INTRAOPERATIVE CHOLANGIOGRAM (N/A) as a surgical intervention .  The patient's history has been reviewed, patient examined, no change in status, stable for surgery.  I have reviewed the patient's chart and labs.  Questions were answered to the patient's satisfaction.     TOTH III,PAUL S

## 2015-07-25 NOTE — Transfer of Care (Signed)
Immediate Anesthesia Transfer of Care Note  Patient: Brooke Avila  Procedure(s) Performed: Procedure(s): LAPAROSCOPIC CHOLECYSTECTOMY WITH INTRAOPERATIVE CHOLANGIOGRAM (N/A)  Patient Location: PACU  Anesthesia Type:General  Level of Consciousness: awake, alert  and oriented  Airway & Oxygen Therapy: Patient Spontanous Breathing and Patient connected to face mask oxygen  Post-op Assessment: Report given to RN and Post -op Vital signs reviewed and stable  Post vital signs: Reviewed and stable  Last Vitals:  Filed Vitals:   07/25/15 0511  BP: 128/68  Pulse: 68  Temp: 36.8 C  Resp: 16    Complications: No apparent anesthesia complications

## 2015-07-25 NOTE — Anesthesia Procedure Notes (Signed)
Procedure Name: Intubation Date/Time: 07/25/2015 10:38 AM Performed by: Enriqueta ShutterWILLIFORD, Brooke Ron D Pre-anesthesia Checklist: Patient identified, Emergency Drugs available, Suction available and Patient being monitored Patient Re-evaluated:Patient Re-evaluated prior to inductionOxygen Delivery Method: Circle System Utilized Preoxygenation: Pre-oxygenation with 100% oxygen Intubation Type: IV induction and Rapid sequence Ventilation: Mask ventilation without difficulty Grade View: Grade II Tube type: Oral Tube size: 7.5 mm Number of attempts: 1 Airway Equipment and Method: Stylet and Oral airway Placement Confirmation: ETT inserted through vocal cords under direct vision,  positive ETCO2 and breath sounds checked- equal and bilateral Secured at: 21 cm Tube secured with: Tape Dental Injury: Teeth and Oropharynx as per pre-operative assessment

## 2015-07-25 NOTE — Progress Notes (Signed)
[facility] PROGRESS NOTE  Brooke Avila ZOX:[id number] DOB: 1976-07-26 DOA: 07/23/2015 PCP: [name], MD  Assessment/Plan: 1. Abd pain/Symptomatic cholelithiasis  -WBC normal, but she is on prednisone which can suppress immune response  -elevated LFTs, was normal on 10/20 -CCS following, CT abd unremarkable except for cholelithiases -s/p Lap Chole today -per [facility] -LFTs improving  2. Ecoli UTI -IV ceftriaxone, FU sensitivities  3. Hypertension: -continue metoprolol  4. Seizures (HCC): Well controlled. Last seizure was 3 years ago -Continue luminal  5. Depression: Stable, -ContinueBuSpar and Zoloft  6. Arthralgia/? Synovitis of DIP and PIP of L hand: -FU with Rheum, on Prednisone per PCP,Currently on 20 mg daily, need to take for 3 days since admit. Symptoms improved.  DVT ppx: SCD, Hep held for OR  Code Status: Full Code Family Communication:spouse at bedside Disposition Plan: Home tomorrow if stable   Consultants:  CCS  Procedures:  Lap Chole 10/25  HPI/Subjective: Just back from OR-slight pain  Objective: Filed Vitals:   07/25/15 1456  BP: 114/66  Pulse: 69  Temp: 97.8 F (36.6 C)  Resp: 15    Intake/Output Summary (Last 24 hours) at 07/25/15 1556 Last data filed at 07/25/15 1245  Gross per 24 hour  Intake   2825 ml  Output    500 ml  Net   2325 ml   Filed Weights   07/24/15 0016  Weight: 87.635 kg (193 lb 3.2 oz)    Exam:   General:  AAOx3  Cardiovascular: S1S2/RRR  Respiratory: CTAB  Abdomen: soft, distended, tender as expected, BS diminished  Musculoskeletal: no edema c/c  Data Reviewed: Basic Metabolic Panel:  Recent Labs Lab 07/20/15 1900 07/23/15 2000 07/24/15 0035 07/25/15 0425  NA 134* 134* 137 140  K 3.8 3.7 3.7 3.9  CL 106 103 108 107  CO2 GLUCOSE 101* 147* 86 82  BUN CREATININE 0.49 0.58 0.49 0.61  CALCIUM 8.8* 9.2 8.7* 8.8*   Liver Function Tests:  Recent  Labs Lab 07/20/15 1900 07/23/15 2000 07/24/15 0035 07/25/15 0425  AST 30 286* 197* 107*  ALT 29 799* 623* 433*  ALKPHOS 48 87 76 67  BILITOT 0.3 1.0 0.8 0.8  PROT 7.2 8.1 7.0 6.7  ALBUMIN 3.9 4.0 3.7 3.6    Recent Labs Lab 07/20/15 1900 07/23/15 2000  LIPASE 28 20   No results for input(s): AMMONIA in the last 168 hours. CBC:  Recent Labs Lab 07/20/15 1900 07/23/15 2000 07/24/15 0035 07/25/15 0425  WBC 9.3 10.2 10.3 9.6  NEUTROABS 6.5 7.5  --   --   HGB 13.0 13.6 12.6 13.2  HCT 38.7 40.1 37.5 40.6  MCV 90.6 90.7 91.9 93.8  PLT 350 370 348 313   Cardiac Enzymes:  Recent Labs Lab 07/20/15 1900  TROPONINI <0.03   BNP (last 3 results) No results for input(s): BNP in the last 8760 hours.  ProBNP (last 3 results) No results for input(s): PROBNP in the last 8760 hours.  CBG:  Recent Labs Lab 07/24/15 0742 07/24/15 1224 07/25/15 0504  GLUCAP 69 109* 63*    Recent Results (from the past 240 hour(s))  Surgical pcr screen     Status: Abnormal   Collection Time: 07/24/15  1:28 AM  Result Value Ref Range Status   MRSA, PCR NEGATIVE NEGATIVE Final   Staphylococcus aureus POSITIVE (A) NEGATIVE Final    Comment:        The Xpert SA Assay (FDA  approved for NASAL specimens in patients over 39 years of age), is one component of a comprehensive surveillance program.  Test performance has been validated by [facility] for patients greater than or equal to 39 year old. It is not intended to diagnose infection nor to guide or monitor treatment.   Urine culture     Status: None (Preliminary result)   Collection Time: 07/24/15  2:09 AM  Result Value Ref Range Status   Specimen Description URINE, CLEAN CATCH  Final   Special Requests NONE  Final   Culture   Final    >=100,000 COLONIES/mL ESCHERICHIA COLI Performed at [facility]    Report Status PENDING  Incomplete     Studies: Dg Cholangiogram Operative  07/25/2015  CLINICAL DATA:   39 year old female with cholelithiasis undergoing cholecystectomy EXAM: INTRAOPERATIVE CHOLANGIOGRAM TECHNIQUE: Cholangiographic images from the C-arm fluoroscopic device were submitted for interpretation post-operatively. Please see the procedural report for the amount of contrast and the fluoroscopy time utilized. COMPARISON:  CT abdomen/pelvis 07/24/2015; abdominal ultrasound 07/23/2015 FINDINGS: Cine run obtained at the time of intraoperative cholangiogram during laparoscopic cholecystectomy demonstrates cannulation of the cystic duct remnant and opacification of the intra and extrahepatic biliary tree. No evidence of biliary dilatation, stenosis, stricture or choledocholithiasis. Contrast material passes freely through the ampulla and into the duodenum. IMPRESSION: Negative intraoperative cholangiogram. Electronically Signed   By: Malachy MoanHeath  McCullough M.D.   On: 07/25/2015 12:41   Ct Abdomen Pelvis W Contrast  07/24/2015  CLINICAL DATA:  Abdominal pain, nausea/vomiting, gallstones on ultrasound EXAM: CT ABDOMEN AND PELVIS WITH CONTRAST TECHNIQUE: Multidetector CT imaging of the abdomen and pelvis was performed using the standard protocol following bolus administration of intravenous contrast. CONTRAST:  100mL OMNIPAQUE IOHEXOL 300 MG/ML  SOLN COMPARISON:  Right upper quadrant ultrasound dated 07/23/2015 FINDINGS: Lower chest:  Lung bases are clear. Hepatobiliary: Liver is within normal limits. No suspicious/enhancing hepatic lesions. Layering tiny gallstones (series 2/ image 21), without associated inflammatory changes. No intrahepatic extrahepatic ductal dilatation. Pancreas: Within normal limits. Spleen: Within normal limits. Adrenals/Urinary Tract: Adrenal glands are within normal limits. Kidneys are within normal limits.  No hydronephrosis. Bladder is within normal limits. Stomach/Bowel: Stomach is within normal limits. No evidence of bowel obstruction. Normal appendix (series 2/ image 58). Moderate left  colonic stool burden. Vascular/Lymphatic: No evidence of abdominal aortic aneurysm. No suspicious abdominopelvic lymphadenopathy. Reproductive: Uterus is within normal limits. Bilateral ovaries are unremarkable, noting a 1.5 cm right corpus luteal cyst. Other: Trace pelvic ascites, likely physiologic. Musculoskeletal: Visualized osseous structures are within normal limits. IMPRESSION: No evidence of bowel obstruction.  Normal appendix. Layering tiny gallstones, without evidence acute cholecystitis. Moderate left colonic stool burden. Electronically Signed   By: Charline BillsSriyesh  Krishnan M.D.   On: 07/24/2015 19:30   [place] Abdomen Limited  07/23/2015  CLINICAL DATA:  39 year old female with right upper quadrant abdominal pain. EXAM: US ABDOMEN LIMITED - RIGHT UPPER QUADRANT COMPARISON:  Abdominal ultrasound 07/20/2015. FINDINGS: Gallbladder: Small echogenic foci with distal acoustic shadowing measuring up to 8 mm in diameter, compatible small gallstones. Gallbladder does not appear distended. Gallbladder wall thickness is normal at 2.8 mm. No pericholecystic fluid. Per report from the sonographer, the patient did exhibit a sonographic Murphy's sign on examination. Common bile duct: Diameter: 3.4 mm in the porta hepatis. Liver: No focal lesion identified. Within normal limits in parenchymal echogenicity. IMPRESSION: 1. Study is positive for cholelithiasis. Although there is no distention of the gallbladder, no gallbladder wall thickening and no pericholecystic fluid,  per report from the sonographer, the patient did exhibit a positive sonographic Murphy's sign. Findings are overall equivocal, but concerning for potential acute cholecystitis and further clinical correlation is recommended. Electronically Signed   By: Daniel  Entrikin M.D.   On: 07/23/2015 23:15    Scheduled Meds: . aTrudie Reed capsule Oral Daily  . busPIRone  30 mg Oral BID  . cefTRIAXone (ROCEPHIN)  IV  1 g Intravenous Q24H  . HYDROmorphone      .  HYDROmorphone      . metoprolol tartrate  25 mg Oral BID  . pantoprazole  40 mg Oral Q1200  . PHENobarbital  97.2 mg Oral Daily  . predniSONE  20 mg Oral Q breakfast  . sertraline  150 mg Oral Daily   Continuous Infusions: . sodium chloride 100 mL/hr at 07/25/15 1454   Antibiotics Given (last 72 hours)    Date/Time Action Medication Dose Rate   07/24/15 0201 Given  [had to notify]   piperacillin-tazobactam (ZOSYN) IVPB 3.375 g 3.375 g 12.5 mL/hr   07/24/15 0750 Given   piperacillin-tazobactam (ZOSYN) IVPB 3.375 g 3.375 g 12.5 mL/hr   07/24/15 1324 Given   cefTRIAXone (ROCEPHIN) 1 g in dextrose 5 % 50 mL IVPB 1 g 100 mL/hr   07/25/15 1435 Given   cefTRIAXone (ROCEPHIN) 1 g in dextrose 5 % 50 mL IVPB 1 g 100 mL/hr      Principal Problem:   Abdominal pain Active Problems:   Cholelithiasis   Hypertension   Seizures (HCC)   Depression   UTI (lower urinary tract infection)   Finger pain, left    Time spent:    [facility]  [facility] Pager [phone]. If 7PM-7AM, please contact night-coverage at [email or web address], password [facility] 07/25/2015, 3:56 PM  LOS: 2 days

## 2015-07-25 NOTE — Anesthesia Postprocedure Evaluation (Signed)
  Anesthesia Post-op Note  Patient: Brooke Avila  Procedure(s) Performed: Procedure(s) (LRB): LAPAROSCOPIC CHOLECYSTECTOMY WITH INTRAOPERATIVE CHOLANGIOGRAM (N/A)  Patient Location: PACU  Anesthesia Type: General  Level of Consciousness: awake and alert   Airway and Oxygen Therapy: Patient Spontanous Breathing  Post-op Pain: mild  Post-op Assessment: Post-op Vital signs reviewed, Patient's Cardiovascular Status Stable, Respiratory Function Stable, Patent Airway and No signs of Nausea or vomiting  Last Vitals:  Filed Vitals:   07/25/15 1208  BP:   Pulse:   Temp: 36.8 C  Resp:     Post-op Vital Signs: stable   Complications: No apparent anesthesia complications

## 2015-07-25 NOTE — Discharge Instructions (Signed)
Laparoscopic Cholecystectomy, Care After °Refer to this sheet in the next few weeks. These instructions provide you with information about caring for yourself after your procedure. Your health care provider may also give you more specific instructions. Your treatment has been planned according to current medical practices, but problems sometimes occur. Call your health care provider if you have any problems or questions after your procedure. °WHAT TO EXPECT AFTER THE PROCEDURE °After your procedure, it is common to have: °· Pain at your incision sites. You will be given pain medicines to control your pain. °· Mild nausea or vomiting. This should improve after the first 24 hours. °· Bloating and possible shoulder pain from the gas that was used during the procedure. This will improve after the first 24 hours. °HOME CARE INSTRUCTIONS °Incision Care °· Follow instructions from your health care provider about how to take care of your incisions. Make sure you: °¨ Wash your hands with soap and water before you change your bandage (dressing). If soap and water are not available, use hand sanitizer. °¨ Change your dressing as told by your health care provider. °¨ Leave stitches (sutures), skin glue, or adhesive strips in place. These skin closures may need to be in place for 2 weeks or longer. If adhesive strip edges start to loosen and curl up, you may trim the loose edges. Do not remove adhesive strips completely unless your health care provider tells you to do that. °· Do not take baths, swim, or use a hot tub until your health care provider approves. Ask your health care provider if you can take showers. You may only be allowed to take sponge baths for bathing. °General Instructions °· Take over-the-counter and prescription medicines only as told by your health care provider. °· Do not drive or operate heavy machinery while taking prescription pain medicine. °· Return to your normal diet as told by your health care  provider. °· Do not lift anything that is heavier than 10 lb (4.5 kg). °· Do not play contact sports for one week or until your health care provider approves. °SEEK MEDICAL CARE IF:  °· You have redness, swelling, or pain at the site of your incision. °· You have fluid, blood, or pus coming from your incision. °· You notice a bad smell coming from your incision area. °· Your surgical incisions break open. °· You have a fever. °SEEK IMMEDIATE MEDICAL CARE IF: °· You develop a rash. °· You have difficulty breathing. °· You have chest pain. °· You have increasing pain in your shoulders (shoulder strap areas). °· You faint or have dizzy episodes while you are standing. °· You have severe pain in your abdomen. °· You have nausea or vomiting that lasts for more than one day. °  °This information is not intended to replace advice given to you by your health care provider. Make sure you discuss any questions you have with your health care provider. °  °Document Released: 09/16/2005 Document Revised: 06/07/2015 Document Reviewed: 04/28/2013 °Elsevier Interactive Patient Education ©2016 [employer] °[facility] ______[facility] °LAPAROSCOPIC SURGERY: POST OP INSTRUCTIONS °Always review your discharge instruction sheet given to you by the facility where your surgery was performed. °IF YOU HAVE DISABILITY OR FAMILY LEAVE FORMS, YOU MUST BRING THEM TO THE OFFICE FOR PROCESSING.   °DO NOT GIVE THEM TO YOUR DOCTOR. ° °1. A prescription for pain medication may be given to you upon discharge.  Take your pain medication as prescribed, if needed.  If narcotic   pain medicine is not needed, then you may take acetaminophen (Tylenol) or ibuprofen (Advil) as needed. °2. Take your usually prescribed medications unless otherwise directed. °3. If you need a refill on your pain medication, please contact your pharmacy.  They will contact our office to request authorization. Prescriptions will not be filled after 5pm or on  week-ends. °4. You should follow a light diet the first few days after arrival home, such as soup and crackers, etc.  Be sure to include lots of fluids daily. °5. Most patients will experience some swelling and bruising in the area of the incisions.  Ice packs will help.  Swelling and bruising can take several days to resolve.  °6. It is common to experience some constipation if taking pain medication after surgery.  Increasing fluid intake and taking a stool softener (such as Colace) will usually help or prevent this problem from occurring.  A mild laxative (Milk of Magnesia or Miralax) should be taken according to package instructions if there are no bowel movements after 48 hours. °7. Unless discharge instructions indicate otherwise, you may remove your bandages 24-48 hours after surgery, and you may shower at that time.  You may have steri-strips (small skin tapes) in place directly over the incision.  These strips should be left on the skin for 7-10 days.  If your surgeon used skin glue on the incision, you may shower in 24 hours.  The glue will flake off over the next 2-3 weeks.  Any sutures or staples will be removed at the office during your follow-up visit. °8. ACTIVITIES:  You may resume regular (light) daily activities beginning the next day--such as daily self-care, walking, climbing stairs--gradually increasing activities as tolerated.  You may have sexual intercourse when it is comfortable.  Refrain from any heavy lifting or straining until approved by your doctor. °a. You may drive when you are no longer taking prescription pain medication, you can comfortably wear a seatbelt, and you can safely maneuver your car and apply brakes. °b. RETURN TO WORK:  __________________________________________________________ °9. You should see your doctor in the office for a follow-up appointment approximately 2-3 weeks after your surgery.  Make sure that you call for this appointment within a day or two after you  arrive home to insure a convenient appointment time. °10. OTHER INSTRUCTIONS: __________________________________________________________________________________________________________________________ __________________________________________________________________________________________________________________________ °WHEN TO CALL YOUR DOCTOR: °1. Fever over 101.0 °2. Inability to urinate °3. Continued bleeding from incision. °4. Increased pain, redness, or drainage from the incision. °5. Increasing abdominal pain ° °The clinic staff is available to answer your questions during regular business hours.  Please don’t hesitate to call and ask to speak to one of the nurses for clinical concerns.  If you have a medical emergency, go to the nearest emergency room or call 911.  A surgeon from [facility] is always on call at the hospital. °[address], [address], [place], [place]  [address] ? [address] [address], [place], [place]   [address] °[phone] ? [phone] ? FAX [phone] °Web site: [email or web address] ° °

## 2015-07-26 DIAGNOSIS — N390 Urinary tract infection, site not specified: Secondary | ICD-10-CM

## 2015-07-26 DIAGNOSIS — I10 Essential (primary) hypertension: Secondary | ICD-10-CM

## 2015-07-26 DIAGNOSIS — R569 Unspecified convulsions: Secondary | ICD-10-CM

## 2015-07-26 DIAGNOSIS — K8020 Calculus of gallbladder without cholecystitis without obstruction: Secondary | ICD-10-CM

## 2015-07-26 LAB — COMPREHENSIVE METABOLIC PANEL
ALK PHOS: 62 U/L (ref 38–126)
ALT: 383 U/L — AB (ref 14–54)
AST: 136 U/L — ABNORMAL HIGH (ref 15–41)
Albumin: 3.5 g/dL (ref 3.5–5.0)
Anion gap: 6 (ref 5–15)
BILIRUBIN TOTAL: 0.7 mg/dL (ref 0.3–1.2)
BUN: 6 mg/dL (ref 6–20)
CALCIUM: 9 mg/dL (ref 8.9–10.3)
CO2: 28 mmol/L (ref 22–32)
Chloride: 106 mmol/L (ref 101–111)
Creatinine, Ser: 0.54 mg/dL (ref 0.44–1.00)
GFR calc non Af Amer: >60 mL/min (ref >60)
Glucose, Bld: 93 mg/dL (ref 65–99)
Potassium: 4 mmol/L (ref 3.5–5.1)
SODIUM: 140 mmol/L (ref 135–145)
TOTAL PROTEIN: 6.5 g/dL (ref 6.5–8.1)

## 2015-07-26 LAB — GLUCOSE, CAPILLARY: GLUCOSE-CAPILLARY: 89 mg/dL (ref 65–99)

## 2015-07-26 LAB — CBC
HCT: 39.4 % (ref 36.0–46.0)
Hemoglobin: 12.9 g/dL (ref 12.0–15.0)
MCH: 30.9 pg (ref 26.0–34.0)
MCHC: 32.7 g/dL (ref 30.0–36.0)
MCV: 94.3 fL (ref 78.0–100.0)
PLATELETS: 307 10*3/uL (ref 150–400)
RBC: 4.18 MIL/uL (ref 3.87–5.11)
RDW: 14.2 % (ref 11.5–15.5)
WBC: 9.7 10*3/uL (ref 4.0–10.5)

## 2015-07-26 MED ORDER — OXYCODONE-ACETAMINOPHEN 5-325 MG PO TABS: 1.0000 | ORAL_TABLET | Freq: Four times a day (QID) | ORAL | Status: AC | PRN

## 2015-07-26 NOTE — Plan of Care (Signed)
Problem: Phase II Progression Outcomes Goal: Discharge plan established Outcome: Completed/Met Date Met:  07/26/15 Pt plans to go home.      

## 2015-07-26 NOTE — Progress Notes (Signed)
1 Day Post-Op  Subjective: Seems much better, advance diet and home later today from our standpoint  Objective: Vital signs in last 24 hours: Temp:  [97.3 F (36.3 C)-98.4 F (36.9 C)] 98.2 F (36.8 C) (10/26 0951) Pulse Rate:  [65-90] 73 (10/26 0951) Resp:  [14-17] 17 (10/26 0951) BP: (95-132)/(57-93) 121/93 mmHg (10/26 0951) SpO2:  [95 %-98 %] 98 % (10/26 0951) Last BM Date: 07/23/15 1200 PO  Diet: clears Voided x 8, volume not recorded Afebrile, VSS Labs OK AST/ALT still up some IOC: Negative intraoperative cholangiogram   Intake/Output from previous day: 10/25 0701 - 10/26 0700 In: 4788.3 [P.O.:1200; I.V.:3588.3] Out: -  Intake/Output this shift: Total I/O In: 240 [P.O.:240] Out: -   General appearance: alert, cooperative and no distress GI: soft, pain better, still sore from the port sites.  Lab Results:   Recent Labs  07/25/15 0425 07/26/15 0604  WBC 9.6 9.7  HGB 13.2 12.9  HCT 40.6 39.4  PLT 313 307    BMET  Recent Labs  07/25/15 0425 07/26/15 0604  NA 140 140  K 3.9 4.0  CL 107 106  CO2 27 28  GLUCOSE 82 93  BUN 8 6  CREATININE 0.61 0.54  CALCIUM 8.8* 9.0   PT/INR  Recent Labs  07/24/15 0035  LABPROT 14.5  INR 1.11     Recent Labs Lab 07/20/15 1900 07/23/15 2000 07/24/15 0035 07/25/15 0425 07/26/15 0604  AST 30 286* 197* 107* 136*  ALT 29 799* 623* 433* 383*  ALKPHOS 48 87 76 67 62  BILITOT 0.3 1.0 0.8 0.8 0.7  PROT 7.2 8.1 7.0 6.7 6.5  ALBUMIN 3.9 4.0 3.7 3.6 3.5     Lipase     Component Value Date/Time   LIPASE 20 07/23/2015 2000     Studies/Results: Dg Cholangiogram Operative  07/25/2015  CLINICAL DATA:  39 year old female with cholelithiasis undergoing cholecystectomy EXAM: INTRAOPERATIVE CHOLANGIOGRAM TECHNIQUE: Cholangiographic images from the C-arm fluoroscopic device were submitted for interpretation post-operatively. Please see the procedural report for the amount of contrast and the fluoroscopy time  utilized. COMPARISON:  CT abdomen/pelvis 07/24/2015; abdominal ultrasound 07/23/2015 FINDINGS: Cine run obtained at the time of intraoperative cholangiogram during laparoscopic cholecystectomy demonstrates cannulation of the cystic duct remnant and opacification of the intra and extrahepatic biliary tree. No evidence of biliary dilatation, stenosis, stricture or choledocholithiasis. Contrast material passes freely through the ampulla and into the duodenum. IMPRESSION: Negative intraoperative cholangiogram. Electronically Signed   By: Malachy MoanHeath  McCullough M.D.   On: 07/25/2015 12:41   Ct Abdomen Pelvis W Contrast  07/24/2015  CLINICAL DATA:  Abdominal pain, nausea/vomiting, gallstones on ultrasound EXAM: CT ABDOMEN AND PELVIS WITH CONTRAST TECHNIQUE: Multidetector CT imaging of the abdomen and pelvis was performed using the standard protocol following bolus administration of intravenous contrast. CONTRAST:  100mL OMNIPAQUE IOHEXOL 300 MG/ML  SOLN COMPARISON:  Right upper quadrant ultrasound dated 07/23/2015 FINDINGS: Lower chest:  Lung bases are clear. Hepatobiliary: Liver is within normal limits. No suspicious/enhancing hepatic lesions. Layering tiny gallstones (series 2/ image 21), without associated inflammatory changes. No intrahepatic extrahepatic ductal dilatation. Pancreas: Within normal limits. Spleen: Within normal limits. Adrenals/Urinary Tract: Adrenal glands are within normal limits. Kidneys are within normal limits.  No hydronephrosis. Bladder is within normal limits. Stomach/Bowel: Stomach is within normal limits. No evidence of bowel obstruction. Normal appendix (series 2/ image 58). Moderate left colonic stool burden. Vascular/Lymphatic: No evidence of abdominal aortic aneurysm. No suspicious abdominopelvic lymphadenopathy. Reproductive: Uterus is within normal limits. Bilateral  ovaries are unremarkable, noting a 1.5 cm right corpus luteal cyst. Other: Trace pelvic ascites, likely physiologic.  Musculoskeletal: Visualized osseous structures are within normal limits. IMPRESSION: No evidence of bowel obstruction.  Normal appendix. Layering tiny gallstones, without evidence acute cholecystitis. Moderate left colonic stool burden. Electronically Signed   By: Charline Bills M.D.   On: 07/24/2015 19:30    Medications: . acidophilus  1 capsule Oral Daily  . busPIRone  30 mg Oral BID  . cefTRIAXone (ROCEPHIN)  IV  1 g Intravenous Q24H  . metoprolol tartrate  25 mg Oral BID  . pantoprazole  40 mg Oral Q1200  . PHENobarbital  97.2 mg Oral Daily  . predniSONE  20 mg Oral Q breakfast  . sertraline  150 mg Oral Daily    Assessment/Plan Cholelithiasis  S/p cholecystectomy Possible UTI Hx of drug abuse, none for 2 year, MJ use since 07/20/15 for symptom control Hx of tobacco use Possible Arthritis, fingers and shoulder, work up last week at [place], on prednisone taper.  Hx of tachycardia on BB Hypertension Depression/PTSD Hx of seizures, none since 2013 Antibiotics: rocephin on call    Home today after eating regular diet, follow up in our office,  i have put follow up in [facility] already.    LOS: 3 days    Cabot Cromartie 07/26/2015

## 2015-07-26 NOTE — Discharge Summary (Addendum)
Physician Discharge Summary  Dessa PhiCrystal M Waltermire ZOX:[id number] DOB: 1976/08/21 DOA: 07/23/2015  PCP: Neldon LabellaMILLER,[name], MD  Admit date: 07/23/2015 Discharge date: 08/01/2015  Time spent: 20 minutes  Recommendations for Outpatient Follow-up:  1. Follow up with PCP as scheduled 2. [facility]   Discharge Diagnoses:  Principal Problem:   Chronic cholecystitis with calculus Active Problems:   Cholelithiasis   Hypertension   Seizures (HCC)   Depression   UTI (lower urinary tract infection)   Abdominal pain   Finger pain, left   Discharge Condition: Improved  Diet recommendation: Regular  Filed Weights   07/24/15 0016  Weight: 87.635 kg (193 lb 3.2 oz)    History of present illness:  Please review dictated H and P from 10/23 for details. Briefly, pt presented with abd pain, nausea and vomiting, found to have abnormally elevated LFT and abd US findings of gallstones with positive Murphy's sign. The patient was admitted for further work up  Hospital Course:  1. Abd pain/Symptomatic cholelithiasis -WBC remained normal, but she is on prednisone which can suppress immune response  -Presented with elevated LFTs, was normal on 10/20 -[facility] was consulted -CT abd was unremarkable except for cholelithiasis -Pt underwent lap chole on 10/25 -LFTs improved - Pt was cleared for discharge per [facility] as of 10/26 with close outpatient follow up  2. Ecoli UTI -Pt had been on IV ceftriaxone ->100,000 ecoli organisms on urine cx with sensitivities still pending -As pt is improving clinically with rocephin, will discharge on ceftin to complete course of abx  3. Hypertension: -continued metoprolol  4. Seizures (HCC):  -Well controlled with last seizure being 3 years ago -Continue luminal  5. Depression:  - Stable, -ContinueBuSpar and Zoloft  6. Arthralgia/? Synovitis of DIP and PIP of L hand: -FU with Rheum, on Prednisone per PCP,Currently on 20 mg  daily, need to take for 3 days since admit. Symptoms improved.  DVT ppx: SCD's while admitted   Procedures:  Lap cholecystectomy 10/25  Consultations:  General Surgery  Discharge Exam: Filed Vitals:   07/25/15 2252 07/26/15 0136 07/26/15 0556 07/26/15 0951  BP: 124/75 95/57 112/89 121/93  Pulse: 70 65 76 73  Temp:  98.4 F (36.9 C) 97.8 F (36.6 C) 98.2 F (36.8 C)  TempSrc:  Oral Oral Oral  Resp:  16 16 17   Height:      Weight:      SpO2:  97% 96% 98%    General: awake, in nad Cardiovascular: regular, s1, s2 Respiratory: normal resp effort, no wheezing  Discharge Instructions     Medication List    STOP taking these medications        predniSONE 20 MG tablet  Commonly known as:  DELTASONE      TAKE these medications        busPIRone 30 MG tablet  Commonly known as:  BUSPAR  Take 30 mg by mouth 2 (two) times daily.     metoprolol tartrate 25 MG tablet  Commonly known as:  LOPRESSOR  Take 25 mg by mouth 2 (two) times daily.     oxyCODONE-acetaminophen 5-325 MG tablet  Commonly known as:  PERCOCET  Take 1-2 tablets by mouth every 6 (six) hours as needed.     oxyCODONE-acetaminophen 5-325 MG tablet  Commonly known as:  PERCOCET  Take 1-2 tablets by mouth every 6 (six) hours as needed for moderate pain or severe pain.     PHENobarbital 97.2 MG tablet  Commonly known as:  LUMINAL  Take 97.2 mg by mouth daily.     PROBIOTIC PO  Take 1 tablet by mouth daily.     TURMERIC PO  Take 400 mg by mouth daily.     ZOLOFT 100 MG tablet  Generic drug:  sertraline  Take 150 mg by mouth daily.       Allergies  Allergen Reactions  . Dilantin [Phenytoin Sodium Extended]     hives  . Imitrex [Sumatriptan] Other (See Comments)    Fatigue   Follow-up Information    Follow up with [facility] On 08/09/2015.   Specialty:  General Surgery   Why:  Your appointment is at 9:45 AM, be at the office at 9:15 AM for check in.   Contact information:    [address] [place] [address] [phone]       Follow up with [facility].   Why:  Go to clinic any weekday and ask for primary care physicain, an appt with a Navigator for insurance, and a follow up medical care   Contact information:   [address] [facility] [id number] [phone]       The results of significant diagnostics from this hospitalization (including imaging, microbiology, ancillary and laboratory) are listed below for reference.    Significant Diagnostic Studies: Dg Cervical Spine 2 Or 3 Views  07/18/2015  CLINICAL DATA:  Left arm pain beginning 6 weeks ago. No known injury. EXAM: CERVICAL SPINE  4+ VIEWS COMPARISON:  None. FINDINGS: Normal alignment. Mild degenerative facet disease bilaterally. Disc spaces are maintained. Prevertebral soft tissues are normal. No fracture. IMPRESSION: Mild degenerative facet disease bilaterally. No acute bony abnormality. Electronically Signed   By: Charlett Nose M.D.   On: 07/18/2015 12:38   Dg Cholangiogram Operative  07/25/2015  CLINICAL DATA:  39 year old female with cholelithiasis undergoing cholecystectomy EXAM: INTRAOPERATIVE CHOLANGIOGRAM TECHNIQUE: Cholangiographic images from the C-arm fluoroscopic device were submitted for interpretation post-operatively. Please see the procedural report for the amount of contrast and the fluoroscopy time utilized. COMPARISON:  CT abdomen/pelvis 07/24/2015; abdominal ultrasound 07/23/2015 FINDINGS: Cine run obtained at the time of intraoperative cholangiogram during laparoscopic cholecystectomy demonstrates cannulation of the cystic duct remnant and opacification of the intra and extrahepatic biliary tree. No evidence of biliary dilatation, stenosis, stricture or choledocholithiasis. Contrast material passes freely through the ampulla and into the duodenum. IMPRESSION: Negative intraoperative cholangiogram. Electronically Signed    By: Malachy Moan M.D.   On: 07/25/2015 12:41   US Abdomen Complete  07/20/2015  CLINICAL DATA:  Right upper quadrant pain for 3 hours EXAM: ULTRASOUND ABDOMEN COMPLETE COMPARISON:  None. FINDINGS: Gallbladder: Multiple gallstones are noted. No gallbladder wall thickening is noted. Positive sonographic Eulah Pont sign is elicited however. Common bile duct: Diameter: 5 mm Liver: No focal lesion identified. Within normal limits in parenchymal echogenicity. IVC: No abnormality visualized. Pancreas: Visualized portion unremarkable. Spleen: Size and appearance within normal limits. Right Kidney: Length: 13.4 cm. Echogenicity within normal limits. No mass or hydronephrosis visualized. Left Kidney: Length: 12.6 cm. Echogenicity within normal limits. No mass or hydronephrosis visualized. Abdominal aorta: The distal aspect of the aorta and bifurcation are not visualized due to overlying bowel gas. Other findings: None. IMPRESSION: Cholelithiasis with positive sonographic Murphy sign. No significant wall thickening is noted however. Electronically Signed   By: Alcide Clever M.D.   On: 07/20/2015 20:24   Ct Abdomen Pelvis W Contrast  07/24/2015  CLINICAL  DATA:  Abdominal pain, nausea/vomiting, gallstones on ultrasound EXAM: CT ABDOMEN AND PELVIS WITH CONTRAST TECHNIQUE: Multidetector CT imaging of the abdomen and pelvis was performed using the standard protocol following bolus administration of intravenous contrast. CONTRAST:  OMNIPAQUE IOHEXOL 300 MG/ML  SOLN COMPARISON:  Right upper quadrant ultrasound dated 07/23/2015 FINDINGS: Lower chest:  Lung bases are clear. Hepatobiliary: Liver is within normal limits. No suspicious/enhancing hepatic lesions. Layering tiny gallstones (series 2/ image 21), without associated inflammatory changes. No intrahepatic extrahepatic ductal dilatation. Pancreas: Within normal limits. Spleen: Within normal limits. Adrenals/Urinary Tract: Adrenal glands are within normal limits.  Kidneys are within normal limits.  No hydronephrosis. Bladder is within normal limits. Stomach/Bowel: Stomach is within normal limits. No evidence of bowel obstruction. Normal appendix (series 2/ image 58). Moderate left colonic stool burden. Vascular/Lymphatic: No evidence of abdominal aortic aneurysm. No suspicious abdominopelvic lymphadenopathy. Reproductive: Uterus is within normal limits. Bilateral ovaries are unremarkable, noting a 1.5 cm right corpus luteal cyst. Other: Trace pelvic ascites, likely physiologic. Musculoskeletal: Visualized osseous structures are within normal limits. IMPRESSION: No evidence of bowel obstruction.  Normal appendix. Layering tiny gallstones, without evidence acute cholecystitis. Moderate left colonic stool burden. Electronically Signed   By: Charline Bills M.D.   On: 07/24/2015 19:30   Dg Hand 2 View Left  07/18/2015  CLINICAL DATA:  Left hand pain beginning 6 weeks ago. No known injury. EXAM: LEFT HAND - 2 VIEW COMPARISON:  None. FINDINGS: No acute bony abnormality. Specifically, no fracture, subluxation, or dislocation. Soft tissues are intact. Small erosions noted at the left ring finger PIP joint. Remainder the joint spaces are unremarkable and maintained. Normal bone mineralization. IMPRESSION: No acute bony abnormality. Electronically Signed   By: Charlett Nose M.D.   On: 07/18/2015 12:40   US Abdomen Limited  07/23/2015  CLINICAL DATA:  39 year old female with right upper quadrant abdominal pain. EXAM: US ABDOMEN LIMITED - RIGHT UPPER QUADRANT COMPARISON:  Abdominal ultrasound 07/20/2015. FINDINGS: Gallbladder: Small echogenic foci with distal acoustic shadowing measuring up to 8 mm in diameter, compatible small gallstones. Gallbladder does not appear distended. Gallbladder wall thickness is normal at 2.8 mm. No pericholecystic fluid. Per report from the sonographer, the patient did exhibit a sonographic Murphy's sign on examination. Common bile duct: Diameter:  3.4 mm in the porta hepatis. Liver: No focal lesion identified. Within normal limits in parenchymal echogenicity. IMPRESSION: 1. Study is positive for cholelithiasis. Although there is no distention of the gallbladder, no gallbladder wall thickening and no pericholecystic fluid, per report from the sonographer, the patient did exhibit a positive sonographic Murphy's sign. Findings are overall equivocal, but concerning for potential acute cholecystitis and further clinical correlation is recommended. Electronically Signed   By: Trudie Reed M.D.   On: 07/23/2015 23:15   Dg Shoulder Left  07/18/2015  CLINICAL DATA:  Left shoulder pain beginning 6 months ago. No known injury. EXAM: LEFT SHOULDER - 2+ VIEW COMPARISON:  02/05/2009 FINDINGS: Interval resection or resorption of the distal left clavicle. Well corticated rounded bone density noted in the region of the distal clavicle. No acute bony abnormality. No acute fracture, subluxation or dislocation. IMPRESSION: No acute bony abnormality. Interval resection or resorption of the distal left clavicle. Electronically Signed   By: Charlett Nose M.D.   On: 07/18/2015 12:41    Microbiology: Recent Results (from the past 240 hour(s))  Surgical pcr screen     Status: Abnormal   Collection Time: 07/24/15  1:28 AM  Result Value Ref Range  Status   MRSA, PCR NEGATIVE NEGATIVE Final   Staphylococcus aureus POSITIVE (A) NEGATIVE Final    Comment:        The Xpert SA Assay (FDA approved for NASAL specimens in patients over 24 years of age), is one component of a comprehensive surveillance program.  Test performance has been validated by [facility] for patients greater than or equal to 11 year old. It is not intended to diagnose infection nor to guide or monitor treatment.   Urine culture     Status: None   Collection Time: 07/24/15  2:09 AM  Result Value Ref Range Status   Specimen Description URINE, CLEAN CATCH  Final   Special Requests NONE   Final   Culture   Final    >=100,000 COLONIES/mL ESCHERICHIA COLI Performed at [facility]    Report Status 07/27/2015 FINAL  Final   Organism ID, Bacteria ESCHERICHIA COLI  Final      Susceptibility   Escherichia coli - MIC*    AMPICILLIN 4 SENSITIVE Sensitive     CEFAZOLIN <=4 SENSITIVE Sensitive     CEFTRIAXONE <=1 SENSITIVE Sensitive     CIPROFLOXACIN <=0.25 SENSITIVE Sensitive     GENTAMICIN <=1 SENSITIVE Sensitive     IMIPENEM <=0.25 SENSITIVE Sensitive     NITROFURANTOIN <=16 SENSITIVE Sensitive     TRIMETH/SULFA <=20 SENSITIVE Sensitive     AMPICILLIN/SULBACTAM 4 SENSITIVE Sensitive     * >=100,000 COLONIES/mL ESCHERICHIA COLI     Labs: Basic Metabolic Panel:  Recent Labs Lab 07/26/15 0604  NA 140  K 4.0  CL 106  CO2 28  GLUCOSE 93  BUN 6  CREATININE 0.54  CALCIUM 9.0   Liver Function Tests:  Recent Labs Lab 07/26/15 0604  AST 136*  ALT 383*  ALKPHOS 62  BILITOT 0.7  PROT 6.5  ALBUMIN 3.5   No results for input(s): LIPASE, AMYLASE in the last 168 hours. No results for input(s): AMMONIA in the last 168 hours. CBC:  Recent Labs Lab 07/26/15 0604  WBC 9.7  HGB 12.9  HCT 39.4  MCV 94.3  PLT 307   Cardiac Enzymes: No results for input(s): CKTOTAL, CKMB, CKMBINDEX, TROPONINI in the last 168 hours. BNP: BNP (last 3 results) No results for input(s): BNP in the last 8760 hours.  ProBNP (last 3 results) No results for input(s): PROBNP in the last 8760 hours.  CBG:  Recent Labs Lab 07/26/15 0555  GLUCAP 89    Signed:  CHIU, STEPHEN K  [facility] 08/01/2015, 5:19 PM

## 2015-07-27 LAB — URINE CULTURE: Culture: >=100000

## 2015-08-01 DIAGNOSIS — K8010 Calculus of gallbladder with chronic cholecystitis without obstruction: Secondary | ICD-10-CM

## 2015-10-13 ENCOUNTER — Ambulatory Visit: Payer: Self-pay | Attending: Internal Medicine | Admitting: Clinical

## 2015-10-13 ENCOUNTER — Ambulatory Visit: Payer: Self-pay

## 2015-10-13 ENCOUNTER — Encounter: Payer: Self-pay | Admitting: Clinical

## 2015-10-13 DIAGNOSIS — Z5989 Other problems related to housing and economic circumstances: Secondary | ICD-10-CM

## 2015-10-13 DIAGNOSIS — Z598 Other problems related to housing and economic circumstances: Secondary | ICD-10-CM

## 2015-10-13 NOTE — Progress Notes (Signed)
ASSESSMENT: Pt currently experiencing distress over having medical expenses and being uninsured. Pt would benefit from financial counseling.  Stage of Change: action  PLAN: 1. F/U with behavioral health consultant in as needed 2. Psychiatric Medications: Buspar, Zoloft. 3. Behavioral recommendation(s):   -Sign up for financial counseling walk-in appointment today at [facility]  SUBJECTIVE: Pt. referred by self for healthcare navigation:  Pt. reports the following symptoms/concerns: Pt states that she is overwhelmed with medical bills, has been referred to [facility] health navigator, but has not heard back from anyone in two weeks, and she needs direction as to how to proceed. Her primary concern is being uninsured.  Duration of problem: At least two weeks Severity: mild  OBJECTIVE: Orientation & Cognition: Oriented x3. Thought processes normal and appropriate to situation. Mood: appropriate. Affect: appropriate Appearance: appropriate Risk of harm to self or others: no known risk of harm to self or others Substance use: alcohol, tobacco Assessments administered: none  Diagnosis: Uninsured medical expenses CPT Code: Z59.8 -------------------------------------------- Other(s) present in the room: none  Time spent with patient in exam room: 7 minutes

## 2015-10-27 ENCOUNTER — Telehealth: Payer: Self-pay | Admitting: Clinical

## 2015-10-27 NOTE — Telephone Encounter (Signed)
Attempt to return pt call; left HIPPA-compliant message to return call to [facility] at [phone].

## 2015-12-31 IMAGING — US US ABDOMEN COMPLETE
1 series · 14 of 25 positions shown · non-contrast
Comparison: None.

CLINICAL DATA: Right upper quadrant pain for 3 hours

EXAM:
ULTRASOUND ABDOMEN COMPLETE

[Series 1: us abdomen complete · 0.17mm/px · 14 of 71 slices shown]
[im 1/71]
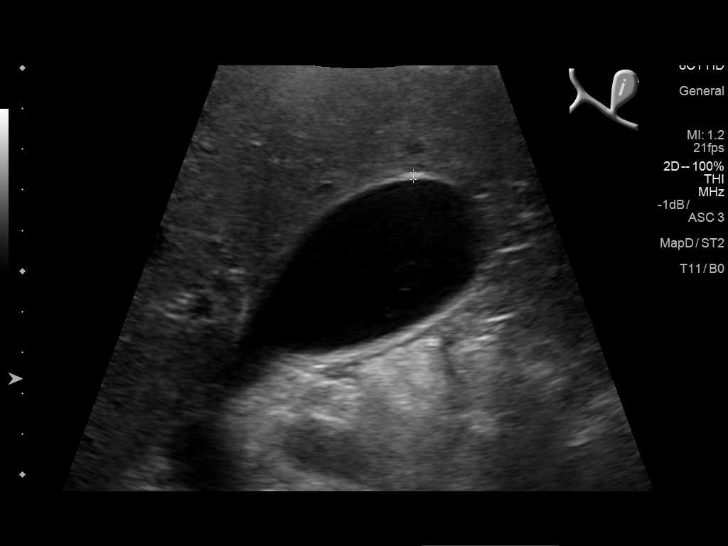
[im 6/71]
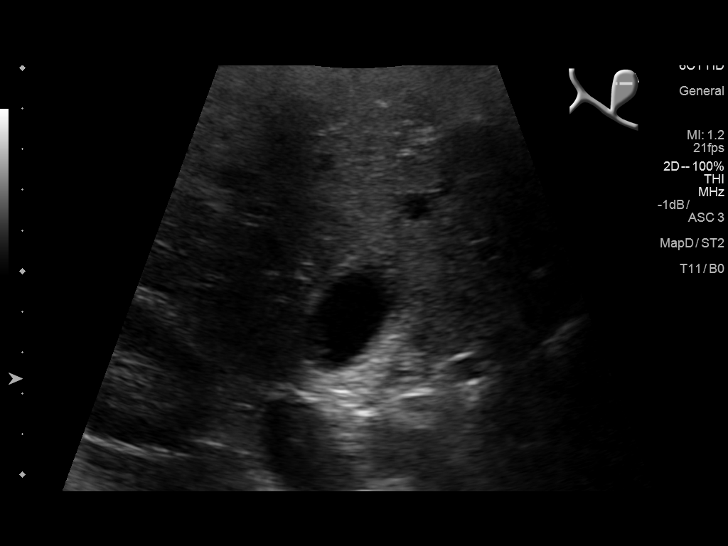
[im 12/71]
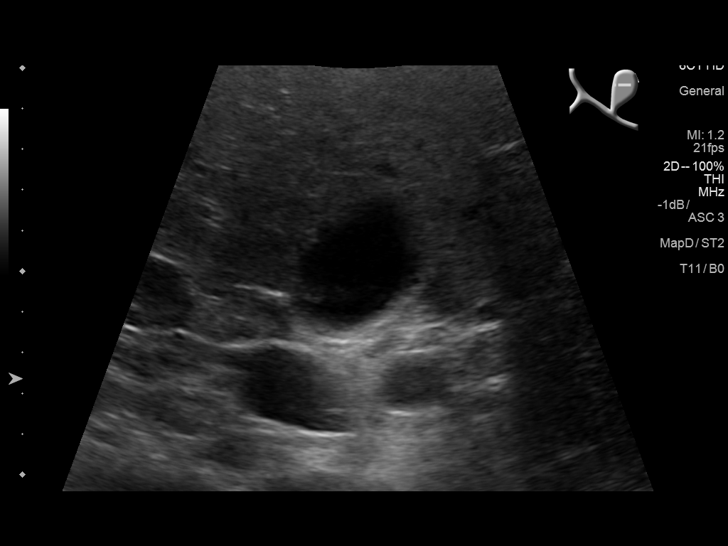
[im 18/71]
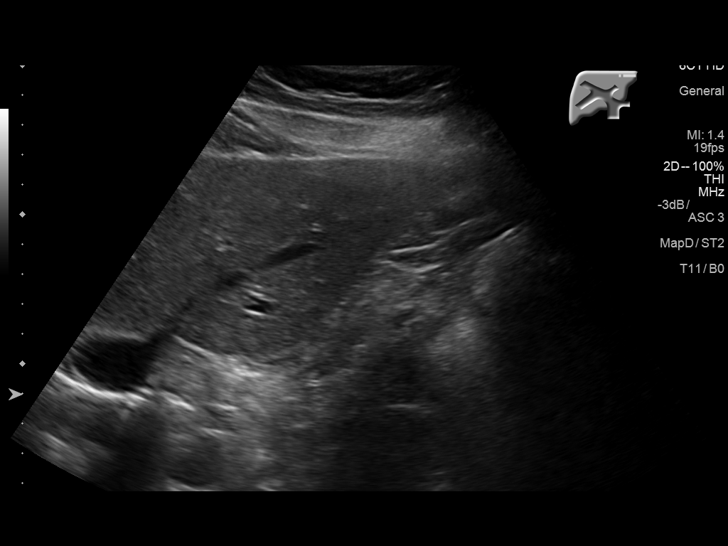
[im 24/71]
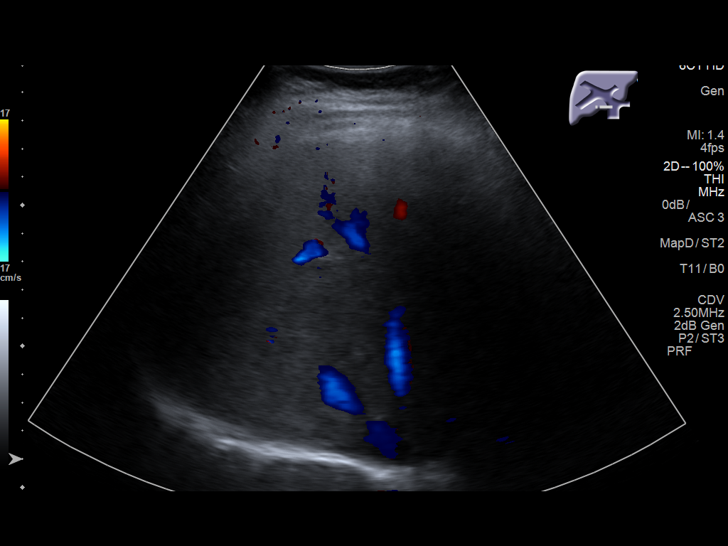
[im 27/71]
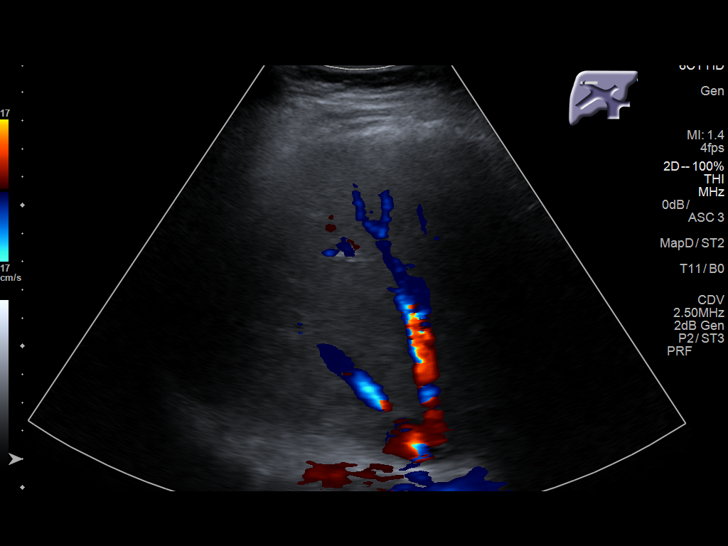
[im 33/71]
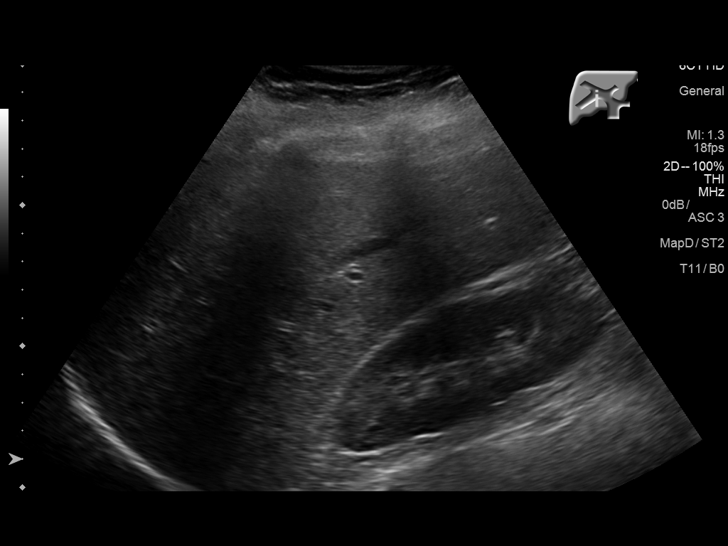
[im 38/71]
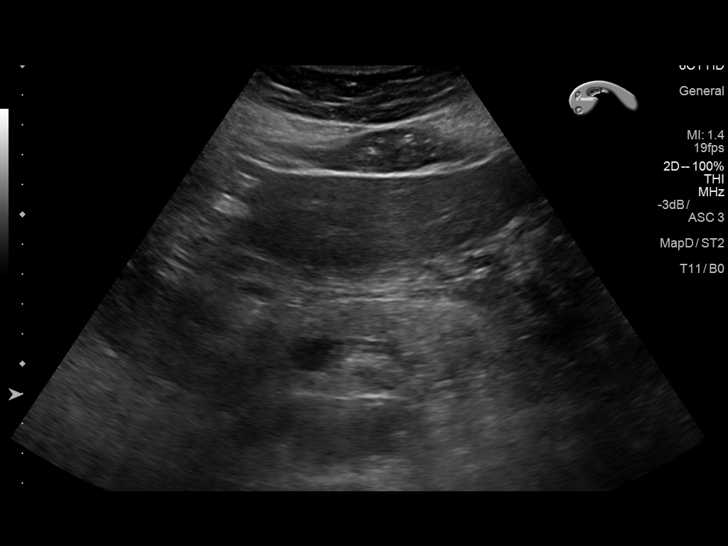
[im 44/71]
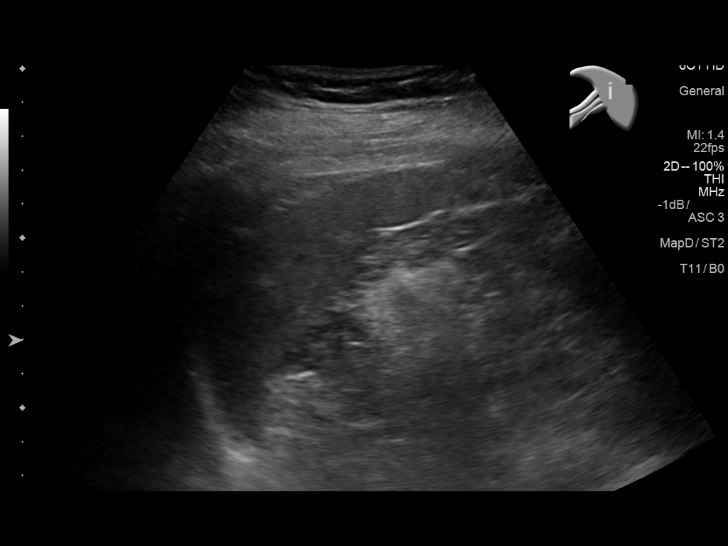
[im 47/71]
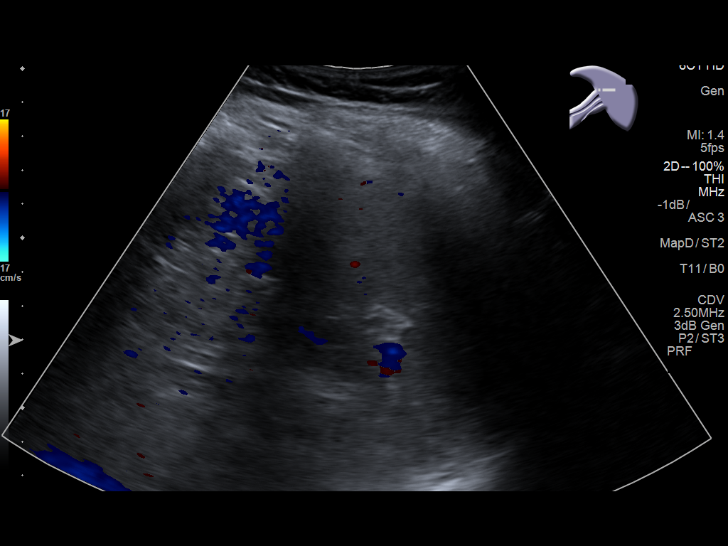
[im 53/71]
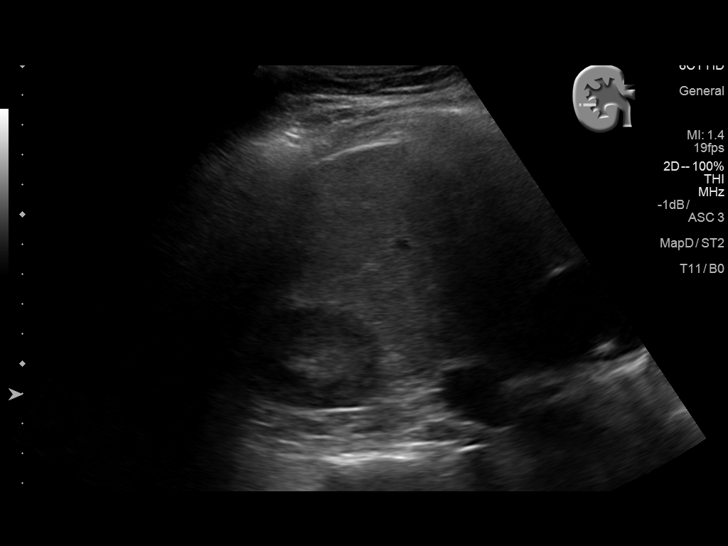
[im 59/71]
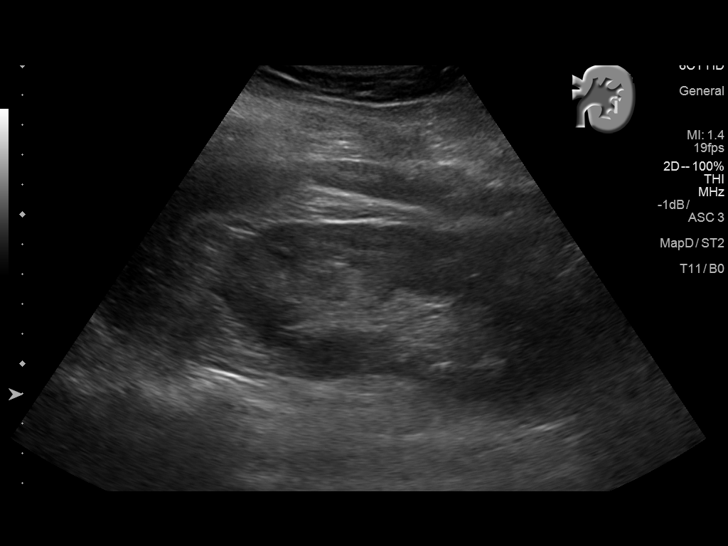
[im 65/71]
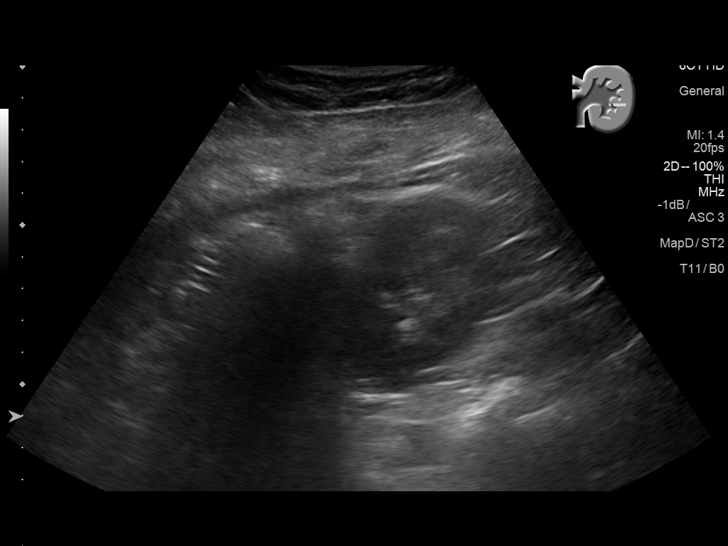
[im 71/71]
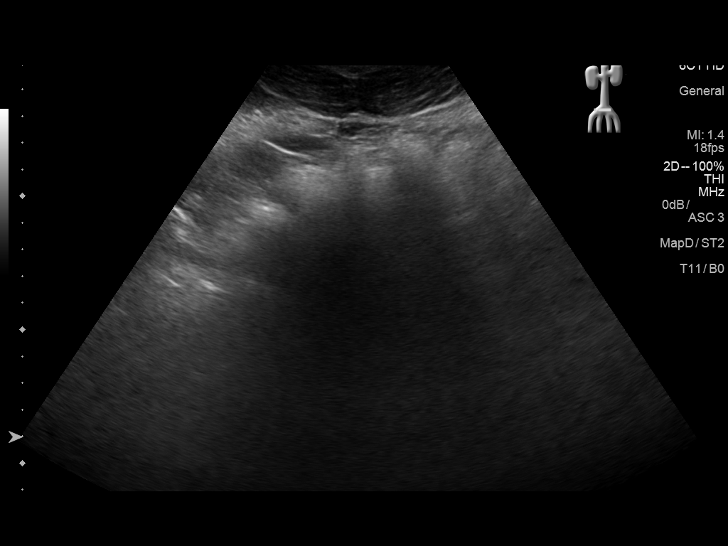

[14 of 25 positions shown; findings below may reference images not displayed]

FINDINGS: Gallbladder: Multiple gallstones are noted. No gallbladder wall
thickening is noted. Positive sonographic Murphy sign is elicited
however.

Common bile duct: Diameter: 5 mm

Liver: No focal lesion identified. Within normal limits in
parenchymal echogenicity.

IVC: No abnormality visualized.

Pancreas: Visualized portion unremarkable.

Spleen: Size and appearance within normal limits.

Right Kidney: Length: 13.4 cm.. Echogenicity within normal limits.
No mass or hydronephrosis visualized.

Left Kidney: Length: 12.6 cm.. Echogenicity within normal limits. No
mass or hydronephrosis visualized.

Abdominal aorta: The distal aspect of the aorta and bifurcation are
not visualized due to overlying bowel gas.

Other findings: None.
IMPRESSION: Cholelithiasis with positive sonographic Murphy sign. No significant
wall thickening is noted however.

## 2016-01-03 IMAGING — US US ABDOMEN LIMITED
1 series · 14 of 25 positions shown · non-contrast
Comparison: Abdominal ultrasound 07/20/2015.

CLINICAL DATA: 39-year-old female with right upper quadrant
abdominal pain.

EXAM:
US ABDOMEN LIMITED - RIGHT UPPER QUADRANT

[Series 1: us abdomen limited · 0.20mm/px · 14 of 42 slices shown]
[im 1/42]
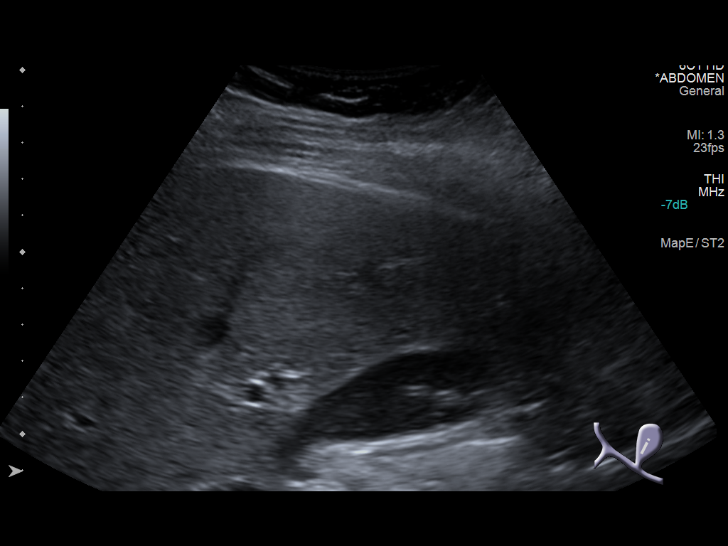
[im 4/42]
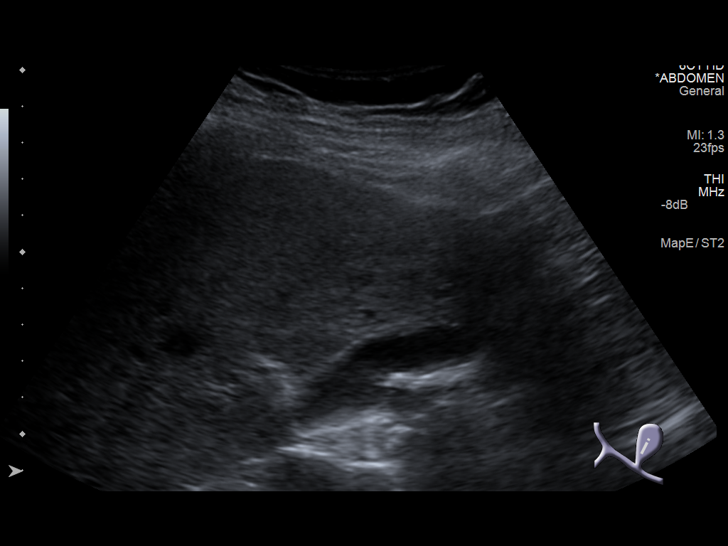
[im 7/42]
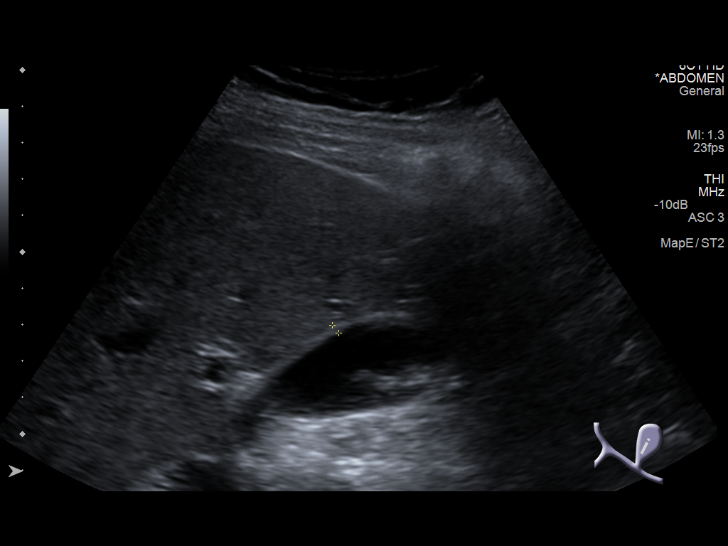
[im 11/42]
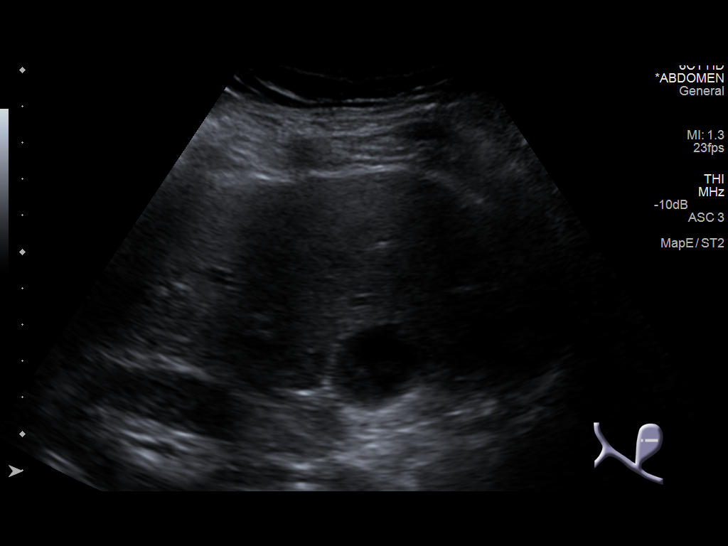
[im 14/42]
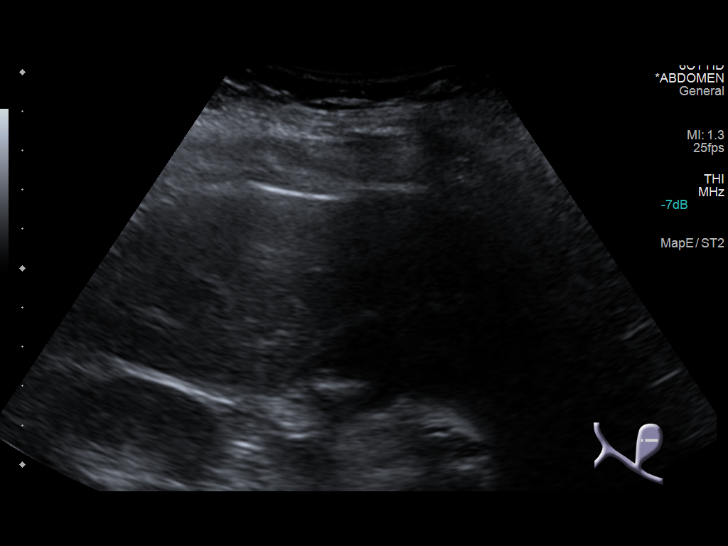
[im 16/42]
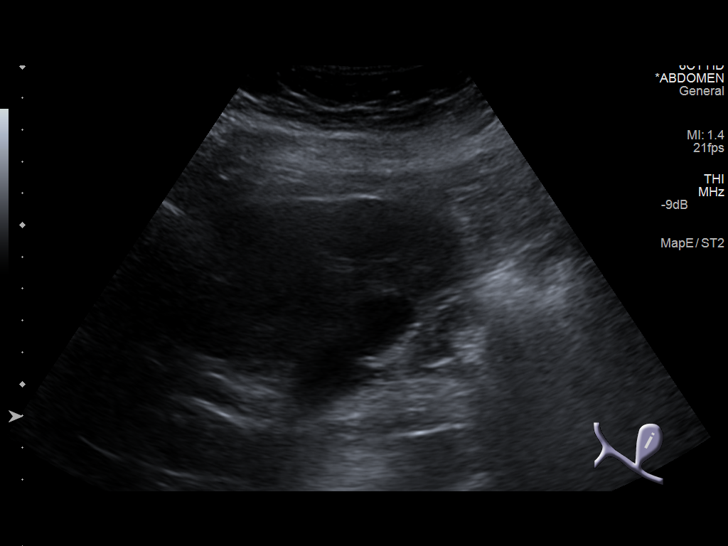
[im 19/42]
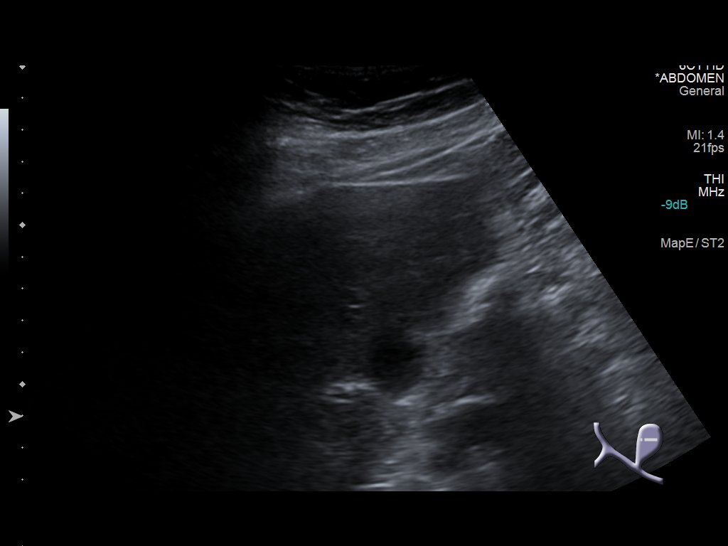
[im 23/42]
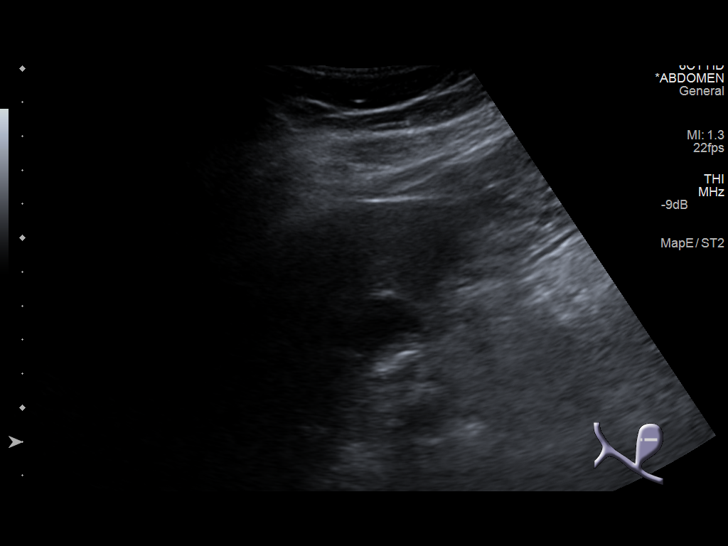
[im 26/42]
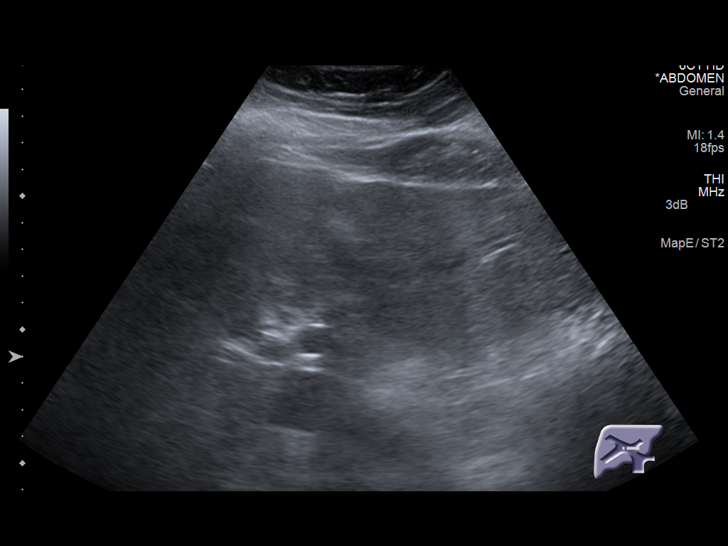
[im 28/42]
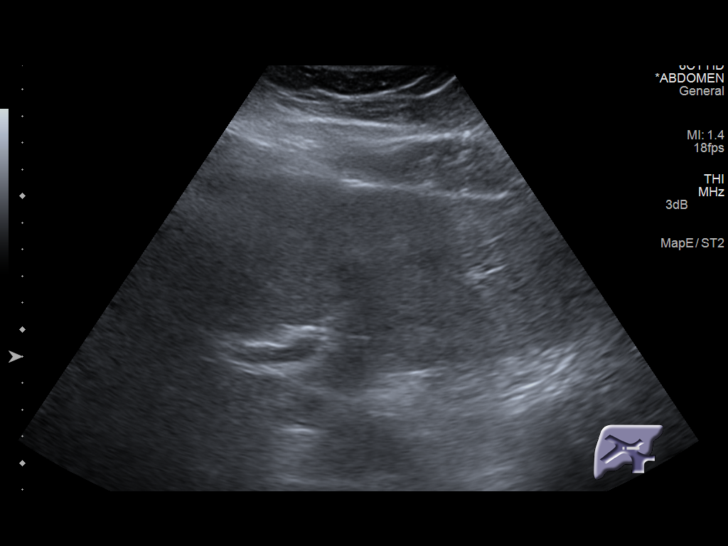
[im 31/42]
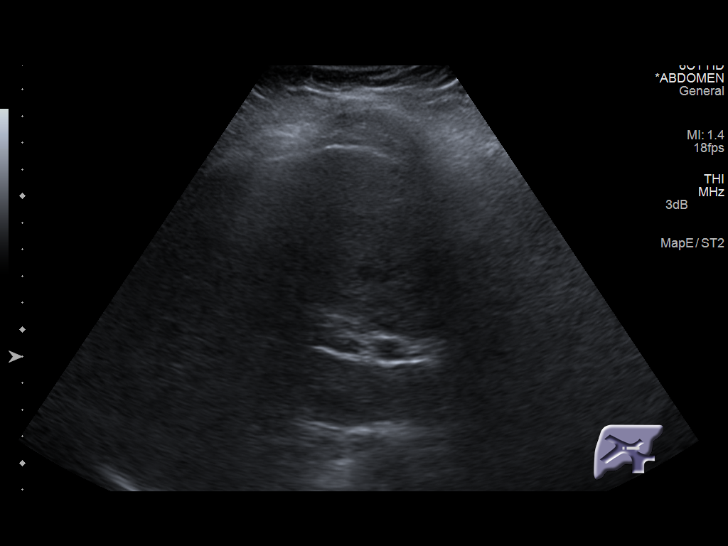
[im 35/42]
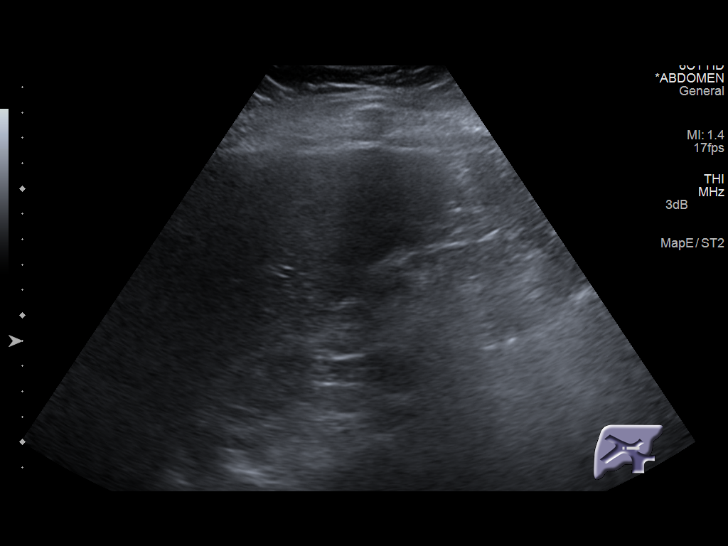
[im 38/42]
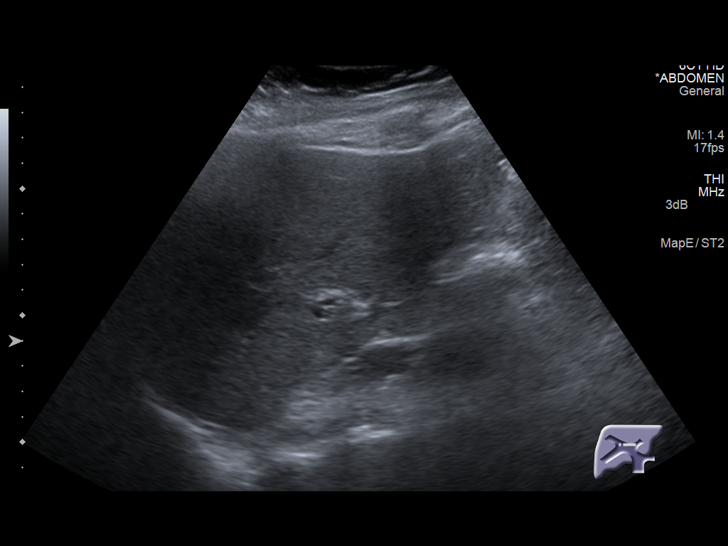
[im 42/42]
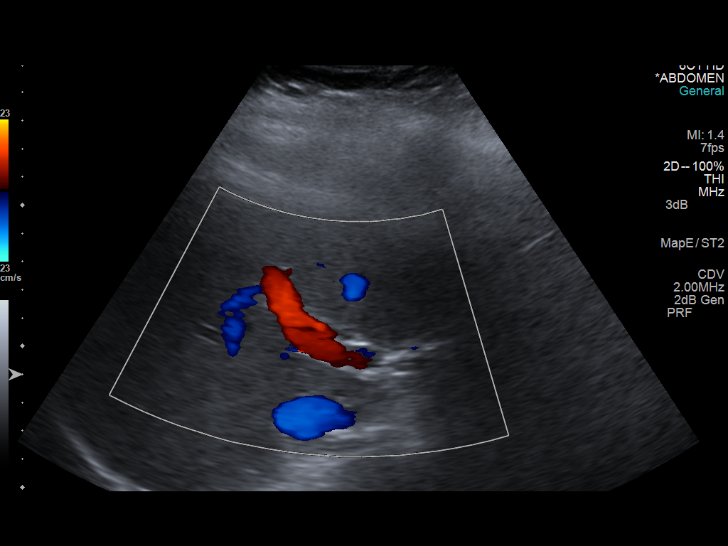

[14 of 25 positions shown; findings below may reference images not displayed]

FINDINGS: Gallbladder:

Small echogenic foci with distal acoustic shadowing measuring up to
8 mm in diameter, compatible small gallstones. Gallbladder does not
appear distended. Gallbladder wall thickness is normal at 2.8 mm. No
pericholecystic fluid. Per report from the sonographer, the patient
did exhibit a sonographic Murphy's sign on examination.

Common bile duct:

Diameter: 3.4 mm in the porta hepatis.

Liver:

No focal lesion identified. Within normal limits in parenchymal
echogenicity.
IMPRESSION: 1. Study is positive for cholelithiasis. Although there is no
distention of the gallbladder, no gallbladder wall thickening and no
pericholecystic fluid, per report from the sonographer, the patient
did exhibit a positive sonographic Murphy's sign. Findings are
overall equivocal, but concerning for potential acute cholecystitis
and further clinical correlation is recommended.

## 2016-08-07 ENCOUNTER — Emergency Department (HOSPITAL_BASED_OUTPATIENT_CLINIC_OR_DEPARTMENT_OTHER)
Admission: EM | Admit: 2016-08-07 | Discharge: 2016-08-07 | Disposition: A | Discharge status: Home / Self Care | Payer: Self-pay | Attending: Emergency Medicine | Admitting: Emergency Medicine

## 2016-08-07 ENCOUNTER — Encounter (HOSPITAL_BASED_OUTPATIENT_CLINIC_OR_DEPARTMENT_OTHER): Payer: Self-pay | Admitting: Emergency Medicine

## 2016-08-07 ENCOUNTER — Emergency Department (HOSPITAL_BASED_OUTPATIENT_CLINIC_OR_DEPARTMENT_OTHER): Payer: Self-pay

## 2016-08-07 DIAGNOSIS — Z87891 Personal history of nicotine dependence: Secondary | ICD-10-CM | POA: Insufficient documentation

## 2016-08-07 DIAGNOSIS — I880 Nonspecific mesenteric lymphadenitis: Secondary | ICD-10-CM | POA: Insufficient documentation

## 2016-08-07 DIAGNOSIS — R109 Unspecified abdominal pain: Secondary | ICD-10-CM

## 2016-08-07 DIAGNOSIS — I10 Essential (primary) hypertension: Secondary | ICD-10-CM | POA: Insufficient documentation

## 2016-08-07 LAB — URINALYSIS, ROUTINE W REFLEX MICROSCOPIC
Bilirubin Urine: NEGATIVE
Glucose, UA: NEGATIVE mg/dL
Hgb urine dipstick: NEGATIVE
Ketones, ur: NEGATIVE mg/dL
Leukocytes, UA: NEGATIVE
Nitrite: NEGATIVE
Protein, ur: NEGATIVE mg/dL
Specific Gravity, Urine: 1.009 (ref 1.005–1.030)
pH: 5.5 (ref 5.0–8.0)

## 2016-08-07 LAB — COMPREHENSIVE METABOLIC PANEL
ALT: 48 U/L (ref 14–54)
AST: 47 U/L — ABNORMAL HIGH (ref 15–41)
Albumin: 3.7 g/dL (ref 3.5–5.0)
Alkaline Phosphatase: 56 U/L (ref 38–126)
Anion gap: 6 (ref 5–15)
BUN: 8 mg/dL (ref 6–20)
CO2: 24 mmol/L (ref 22–32)
Calcium: 8.9 mg/dL (ref 8.9–10.3)
Chloride: 106 mmol/L (ref 101–111)
Creatinine, Ser: 0.57 mg/dL (ref 0.44–1.00)
GFR calc Af Amer: >60 mL/min (ref >60)
GFR calc non Af Amer: >60 mL/min (ref >60)
Glucose, Bld: 89 mg/dL (ref 65–99)
Potassium: 3.6 mmol/L (ref 3.5–5.1)
Sodium: 136 mmol/L (ref 135–145)
Total Bilirubin: 0.4 mg/dL (ref 0.3–1.2)
Total Protein: 7.1 g/dL (ref 6.5–8.1)

## 2016-08-07 LAB — CBC WITH DIFFERENTIAL/PLATELET
Basophils Absolute: 0 10*3/uL (ref 0.0–0.1)
Basophils Relative: 0 %
Eosinophils Absolute: 0.2 10*3/uL (ref 0.0–0.7)
Eosinophils Relative: 3 %
HCT: 38.1 % (ref 36.0–46.0)
Hemoglobin: 13 g/dL (ref 12.0–15.0)
Lymphocytes Relative: 27 %
Lymphs Abs: 1.8 10*3/uL (ref 0.7–4.0)
MCH: 31 pg (ref 26.0–34.0)
MCHC: 34.1 g/dL (ref 30.0–36.0)
MCV: 90.9 fL (ref 78.0–100.0)
Monocytes Absolute: 0.7 10*3/uL (ref 0.1–1.0)
Monocytes Relative: 10 %
Neutro Abs: 4.1 10*3/uL (ref 1.7–7.7)
Neutrophils Relative %: 60 %
Platelets: 307 10*3/uL (ref 150–400)
RBC: 4.19 MIL/uL (ref 3.87–5.11)
RDW: 12.9 % (ref 11.5–15.5)
WBC: 6.8 10*3/uL (ref 4.0–10.5)

## 2016-08-07 LAB — OCCULT BLOOD X 1 CARD TO LAB, STOOL: Fecal Occult Bld: NEGATIVE

## 2016-08-07 LAB — LIPASE, BLOOD: Lipase: 18 U/L (ref 11–51)

## 2016-08-07 LAB — PREGNANCY, URINE: Preg Test, Ur: NEGATIVE

## 2016-08-07 MED ORDER — SODIUM CHLORIDE 0.9 % IV BOLUS (SEPSIS)
1000.0000 mL | Freq: Once | INTRAVENOUS | Status: AC
  Administered 2016-08-07: 1000 mL via INTRAVENOUS

## 2016-08-07 MED ORDER — HYDROCODONE-ACETAMINOPHEN 5-325 MG PO TABS: 1.0000 | ORAL_TABLET | Freq: Four times a day (QID) | ORAL | 0 refills | Status: AC | PRN

## 2016-08-07 MED ORDER — IOPAMIDOL (ISOVUE-300) INJECTION 61%
100.0000 mL | Freq: Once | INTRAVENOUS | Status: AC | PRN
  Administered 2016-08-07: 100 mL via INTRAVENOUS

## 2016-08-07 MED ORDER — ONDANSETRON HCL 4 MG/2ML IJ SOLN
4.0000 mg | Freq: Once | INTRAMUSCULAR | Status: AC
  Administered 2016-08-07: 4 mg via INTRAVENOUS
  Filled 2016-08-07: qty 2

## 2016-08-07 MED ORDER — MORPHINE SULFATE (PF) 4 MG/ML IV SOLN
4.0000 mg | Freq: Once | INTRAVENOUS | Status: AC
  Administered 2016-08-07: 4 mg via INTRAVENOUS
  Filled 2016-08-07: qty 1

## 2016-08-07 NOTE — Discharge Instructions (Signed)
Hydrocodone as prescribed as needed for pain.  Follow-up with Dr. Dulce Sellaroutlaw in the next week, and return to the emergency department if you develop high fever, worsening pain, bloody stools, or other new and concerning symptoms.

## 2016-08-07 NOTE — ED Provider Notes (Signed)
[facility]-EMERGENCY DEPT Provider Note   CSN: [id number] Arrival date & time: 08/07/16  1657  By signing my name below, I, Phillis HaggisGabriella Gaje, attest that this documentation has been prepared under the direction and in the presence of [employer] Electriclexandra Willis Kuipers, PA-C. Electronically Signed: Phillis HaggisGabriella Gaje, ED Scribe. 08/07/16. 5:17 PM.  History   Chief Complaint Chief Complaint  Patient presents with  . Abdominal Pain   The history is provided by the patient. No language interpreter was used.  HPI Comments: Brooke Avila is a 40 y.o. female with a hx of HTN and cholecystectomy who presents to the Emergency Department complaining of gradually worsening LLQ abdominal pain onset 2 days ago. Pt reports associated chills, yellow-colored diarrhea, left flank pain, nausea, and vomiting that has since resolved. Pt says that she is unable to eat or drink without having diarrhea. Pt was seen by her PCP on 08/05/16 and diagnosed with diverticulitis. Pt did not have any imaging done at her PCP's office. Pt reports hx of diarrhea, but never this severe. Pt has a GI doctor. She was prescribed Cipro and Flagyl for her symptoms. Pt has been taking the medication to no relief. Pt denies hx of kidney stones, fever, hematochezia, current vomiting, back pain, dysuria, hematuria, or pelvic pain.   Past Medical History:  Diagnosis Date  . Depression   . Hypertension   . Palpitations   . PTSD (post-traumatic stress disorder)   . Seizures [facility](HCC)     Patient Active Problem List   Diagnosis Date Noted  . Chronic cholecystitis with calculus 08/01/2015  . Elevated liver function tests   . Essential hypertension   . Gallstone   . Cholelithiasis 07/23/2015  . UTI (lower urinary tract infection) 07/23/2015  . Abdominal pain 07/23/2015  . Finger pain, left 07/23/2015  . Hypertension   . Seizures (HCC)   . Depression     Past Surgical History:  Procedure Laterality Date  . CHOLECYSTECTOMY N/A 07/25/2015   Procedure:  LAPAROSCOPIC CHOLECYSTECTOMY WITH INTRAOPERATIVE CHOLANGIOGRAM;  Surgeon: Chevis PrettyPaul Toth III, MD;  Location: [facility];  Service: General;  Laterality: N/A;  . DILATION AND CURETTAGE OF UTERUS    . FACIAL COSMETIC SURGERY    . INDUCED ABORTION      OB History    No data available       Home Medications    Prior to Admission medications   Medication Sig Start Date End Date Taking? Authorizing Provider  busPIRone (BUSPAR) 30 MG tablet Take 30 mg by mouth 2 (two) times daily.    Historical Provider, MD  HYDROcodone-acetaminophen (NORCO) 5-325 MG tablet Take 1-2 tablets by mouth every 6 (six) hours as needed. 08/07/16   Geoffery Lyonsouglas Delo, MD  metoprolol tartrate (LOPRESSOR) 25 MG tablet Take 25 mg by mouth 2 (two) times daily. 07/10/15   Historical Provider, MD  oxyCODONE-acetaminophen (PERCOCET) 5-325 MG tablet Take 1-2 tablets by mouth every 6 (six) hours as needed. Patient taking differently: Take 1-2 tablets by mouth every 6 (six) hours as needed for moderate pain or severe pain.  07/20/15   Geoffery Lyonsouglas Delo, MD  oxyCODONE-acetaminophen (PERCOCET) 5-325 MG tablet Take 1-2 tablets by mouth every 6 (six) hours as needed for moderate pain or severe pain. 07/26/15   Jerald KiefStephen K Chiu, MD  PHENobarbital (LUMINAL) 97.2 MG tablet Take 97.2 mg by mouth daily. 07/08/15   Historical Provider, MD  Probiotic Product (PROBIOTIC PO) Take 1 tablet by mouth daily.    Historical Provider, MD  sertraline (ZOLOFT) 100 MG tablet  Take 150 mg by mouth daily.    Historical Provider, MD  TURMERIC PO Take 400 mg by mouth daily.    Historical Provider, MD    Family History History reviewed. No pertinent family history.  Social History Social History  Substance Use Topics  . Smoking status: Former Smoker    Packs/day: 0.50    Types: E-cigarettes  . Smokeless tobacco: Never Used  . Alcohol use Yes     Comment: Rarely - Had 3 beers tonight     Allergies   Dilantin [phenytoin sodium extended] and Imitrex  [sumatriptan]  Review of Systems Review of Systems  Constitutional: Positive for chills. Negative for fever.  Gastrointestinal: Positive for abdominal pain, diarrhea and nausea. Negative for blood in stool and vomiting.  Genitourinary: Positive for flank pain. Negative for dysuria, hematuria and pelvic pain.  Musculoskeletal: Negative for back pain.  Skin: Negative for rash and wound.   Physical Exam Updated Vital Signs BP 105/75 (BP Location: Right Arm)   Pulse 80   Temp 99.1 F (37.3 C)   Resp 18   Ht 5\' 4"  (1.626 m)   Wt 91.6 kg   LMP 07/31/2016   SpO2 98%   BMI 34.67 kg/m   Physical Exam  Constitutional: She appears well-developed and well-nourished. No distress.  HENT:  Head: Normocephalic and atraumatic.  Mouth/Throat: Oropharynx is clear and moist. Mucous membranes are dry. No oropharyngeal exudate.  Mildly dry mucous membranes  Eyes: Conjunctivae are normal. Pupils are equal, round, and reactive to light. Right eye exhibits no discharge. Left eye exhibits no discharge. No scleral icterus.  Neck: Normal range of motion. Neck supple. No thyromegaly present.  Cardiovascular: Normal rate, regular rhythm, normal heart sounds and intact distal pulses.  Exam reveals no gallop and no friction rub.   No murmur heard. Pulmonary/Chest: Effort normal and breath sounds normal. No stridor. No respiratory distress. She has no wheezes. She has no rales.  Abdominal: Soft. Bowel sounds are normal. She exhibits no distension. There is tenderness in the periumbilical area and left lower quadrant. There is guarding and CVA tenderness. There is no rebound and no tenderness at McBurney's point.  Significant abdominal tenderness; Left CVA tenderness  Genitourinary: Rectal exam shows no external hemorrhoid, no internal hemorrhoid, no mass and anal tone normal.  Musculoskeletal: She exhibits no edema.  Lymphadenopathy:    She has no cervical adenopathy.  Neurological: She is alert.  Coordination normal.  Skin: Skin is warm and dry. No rash noted. She is not diaphoretic. No pallor.  Psychiatric: She has a normal mood and affect.  Nursing note and vitals reviewed.  ED Treatments / Results  DIAGNOSTIC STUDIES: Oxygen Saturation is 98% on RA, normal by my interpretation.    COORDINATION OF CARE: 5:14 PM-Discussed treatment plan which includes IV fluids and CT scan with pt at bedside and pt agreed to plan.    Labs (all labs ordered are listed, but only abnormal results are displayed) Labs Reviewed  COMPREHENSIVE METABOLIC PANEL - Abnormal; Notable for the following:       Result Value   AST 47 (*)    All other components within normal limits  CBC WITH DIFFERENTIAL/PLATELET  LIPASE, BLOOD  URINALYSIS, ROUTINE W REFLEX MICROSCOPIC (NOT AT [facility])  PREGNANCY, URINE  OCCULT BLOOD X 1 CARD TO LAB, STOOL    EKG  EKG Interpretation None       Radiology Ct Abdomen Pelvis W Contrast  Result Date: 08/07/2016 CLINICAL DATA:  Left lower  quadrant pain EXAM: CT ABDOMEN AND PELVIS WITH CONTRAST TECHNIQUE: Multidetector CT imaging of the abdomen and pelvis was performed using the standard protocol following bolus administration of intravenous contrast. Oral contrast was also administered. CONTRAST:  ISOVUE-300 IOPAMIDOL (ISOVUE-300) INJECTION 61% COMPARISON:  July 24, 2015 FINDINGS: Lower chest: There is mild bibasilar lung atelectatic change. Hepatobiliary: No focal liver lesions are evident. Gallbladder is absent. There is no biliary duct dilatation. Pancreas: No pancreatic mass or inflammatory focus. Spleen: No splenic lesions are evident. Adrenals/Urinary Tract: Adrenals appear unremarkable bilaterally. Kidneys bilaterally show no evidence of mass or hydronephrosis on either side. No renal or ureteral calculus is evident on either side. Urinary bladder is midline with wall thickness within normal limits. There is a 2 mm calcification along the posterior wall of the  urinary bladder inferiorly, also present on prior study. Stomach/Bowel: There is no bowel wall or mesenteric thickening. No bowel obstruction. No free air or portal venous air is evident. There is lipomatous infiltration of the ileocecal valve. Appendix is diminutive. No appendiceal lesion identified. No ascites or abscess is identified in the abdomen or pelvis. Vascular/Lymphatic: There is no abdominal aortic aneurysm. No vascular lesions are evident. No adenopathy is appreciable in the abdomen or pelvis by size criteria. There are scattered subcentimeter mesenteric lymph nodes, primarily in the right lower quadrant. Reproductive: Uterus is anteverted. No pelvic mass is appreciable beyond what is felt to represent a dominant physiologic follicle in the right ovary measuring 2.8 x 2.0 cm. No with fluid collection. Other: Appendix appears diminutive without focal lesion. No ascites or abscess is evident in the abdomen or pelvis. Musculoskeletal: There is no evidence of blastic or lytic bone lesion. No abdominal wall or intramuscular lesion. IMPRESSION: No bowel obstruction. No evidence of diverticulitis. No bowel wall thickening. There are scattered subcentimeter mesenteric lymph nodes. In the appropriate clinical setting, these lymph nodes could be indicative of mesenteric adenitis. There is no adenopathy by size criteria. Gallbladder absent. No renal or ureteral calculus. No hydronephrosis. Chronic 2 mm calcification along the inferior posterior wall of the urinary bladder, slightly toward the right. Electronically Signed   By: Bretta Bang III M.D.   On: 08/07/2016 20:12    Procedures Procedures (including critical care time)  Medications Ordered in ED Medications  sodium chloride 0.9 % bolus 1,000 mL (0 mLs Intravenous Stopped 08/07/16 1910)  ondansetron (ZOFRAN) injection 4 mg (4 mg Intravenous Given 08/07/16 1746)  morphine 4 MG/ML injection 4 mg (4 mg Intravenous Given 08/07/16 1757)  iopamidol  (ISOVUE-300) 61 % injection 100 mL (100 mLs Intravenous Contrast Given 08/07/16 1947)     Initial Impression / Assessment and Plan / ED Course  I have reviewed the triage vital signs and the nursing notes.  Pertinent labs & imaging results that were available during my care of the patient were reviewed by me and considered in my medical decision making (see chart for details).  Clinical Course    CBC unremarkable. CMP shows AST 47, otherwise unremarkable. Lipase 18. UA negative. Urine pregnancy negative. Fecal occult negative. CT abdomen and pelvis pending. At shift change, patient care transferred to Felicie Morn, NP for continued evaluation, follow up of CT  and determination of disposition.    Final Clinical Impressions(s) / ED Diagnoses   Final diagnoses:  Abdominal pain, unspecified abdominal location  Mesenteric adenitis  I personally performed the services described in this documentation, which was scribed in my presence. The recorded information has been reviewed and is  accurate.   New Prescriptions Discharge Medication List as of 08/07/2016  8:51 PM    START taking these medications   Details  HYDROcodone-acetaminophen (NORCO) 5-325 MG tablet Take 1-2 tablets by mouth every 6 (six) hours as needed., Starting Wed 08/07/2016, Print         Emi Holes, PA-C 08/08/16 1610    Geoffery Lyons, MD 08/08/16 1504

## 2016-08-07 NOTE — ED Notes (Signed)
Pt in CT.

## 2016-08-07 NOTE — ED Notes (Signed)
Pt returned from CT °

## 2016-08-07 NOTE — ED Triage Notes (Signed)
Patient states that she is having pain to her left lower abdominal  pain.  Reports that she was dx by her PMD with Diverticulitis on Monday and is currently being treated for. The patient reports that her pain is worse and she can not keep anything down.

## 2016-10-30 IMAGING — CR DG CERVICAL SPINE 2 OR 3 VIEWS
4 series · 4 of 4 positions shown · non-contrast
Comparison: None.

CLINICAL DATA: Left arm pain beginning 6 weeks ago. No known
injury.

EXAM:
CERVICAL SPINE  4+ VIEWS

[w cervical spine lat]
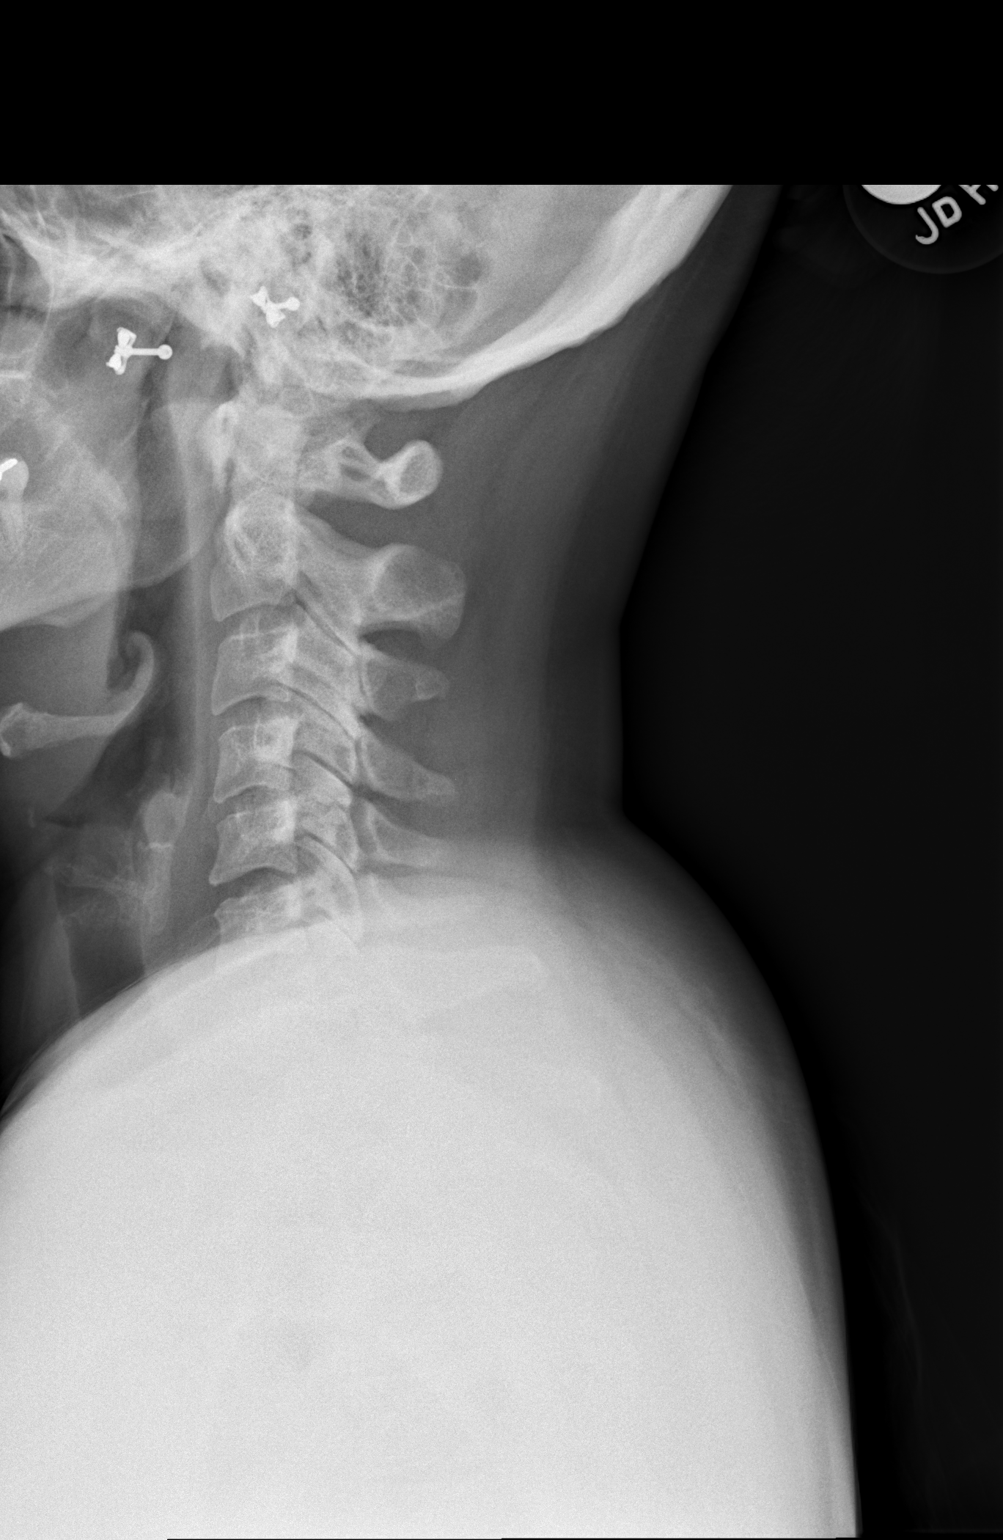

[w cervical spine ap]
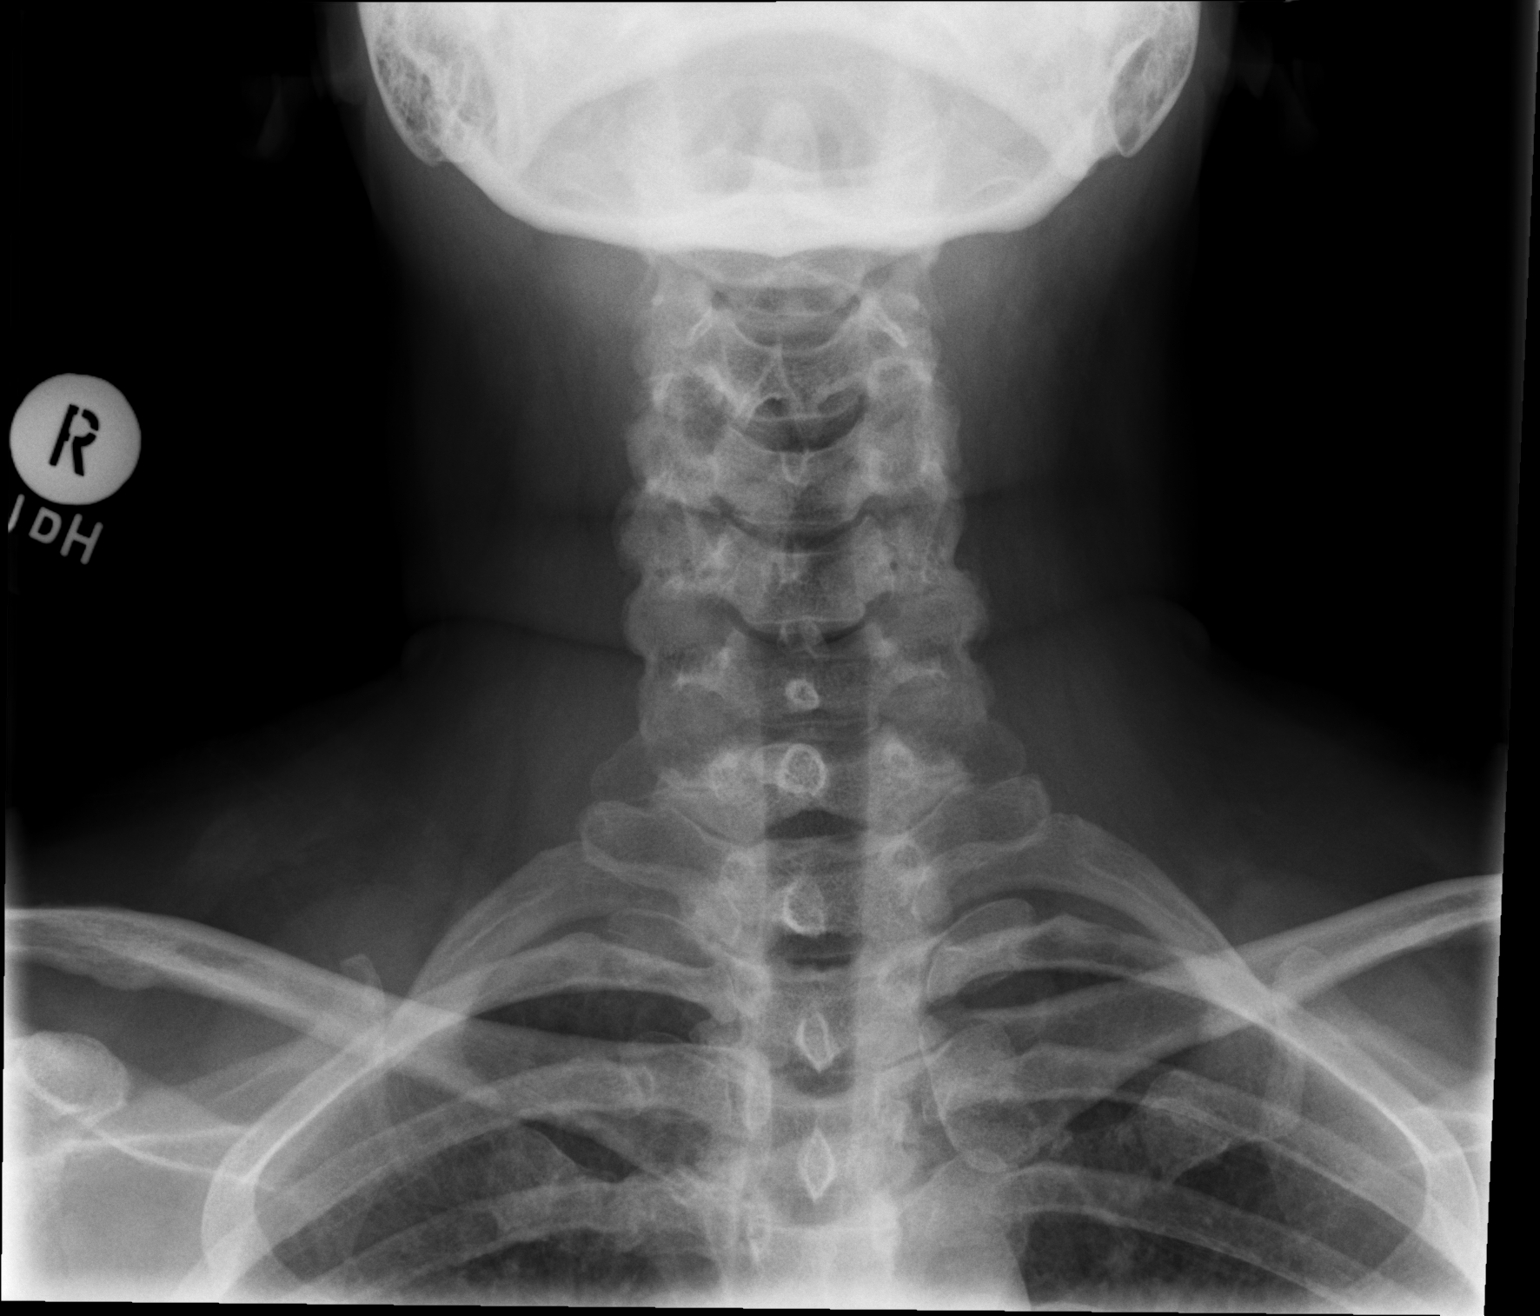

[w cervical swimmers]
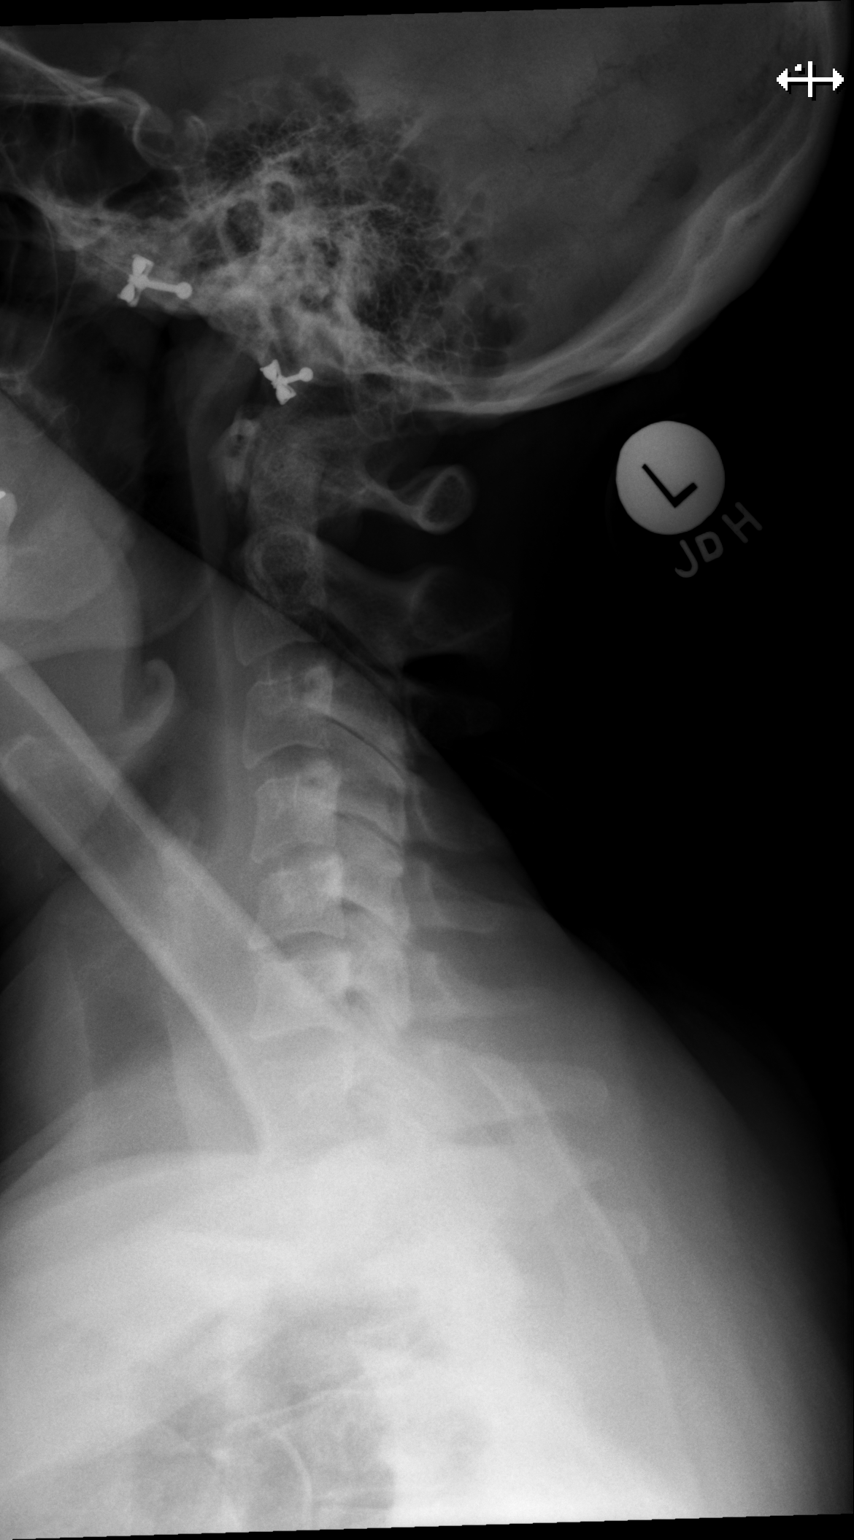

[t cervical spine odontoid]
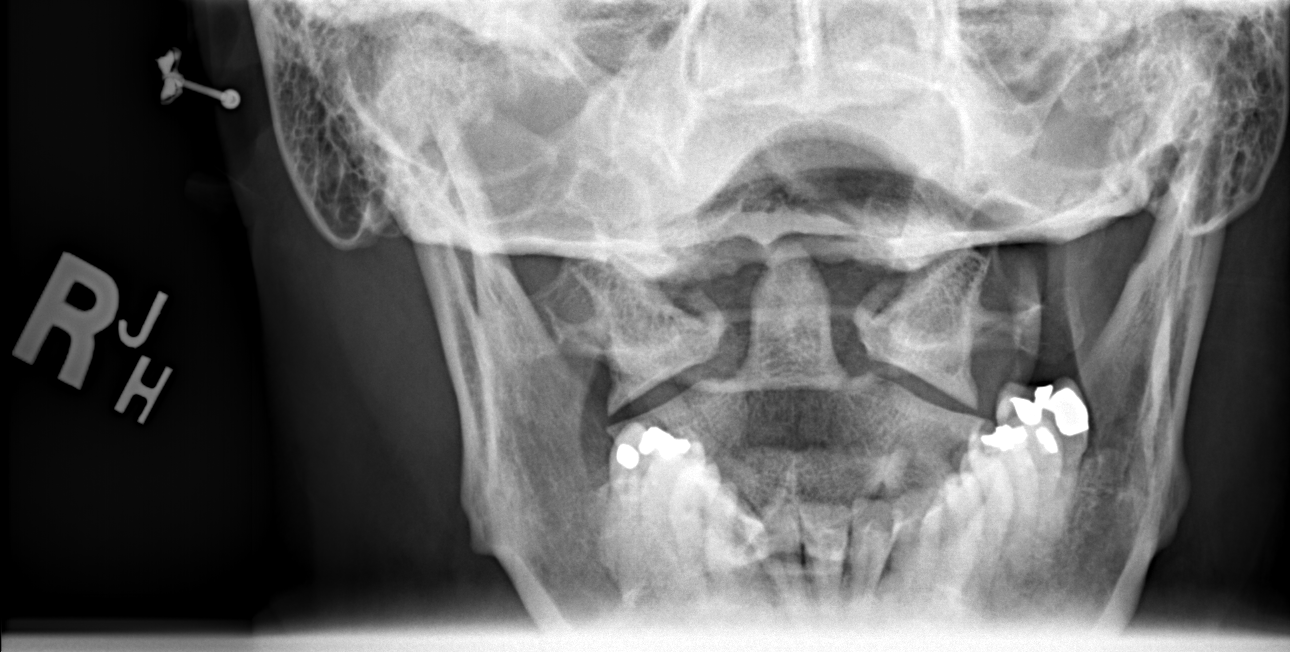

[4 of 4 positions shown; findings below may reference images not displayed]

FINDINGS: Normal alignment. Mild degenerative facet disease bilaterally. Disc
spaces are maintained. Prevertebral soft tissues are normal. No
fracture.
IMPRESSION: Mild degenerative facet disease bilaterally. No acute bony
abnormality.

## 2016-10-30 IMAGING — CR DG SHOULDER 2+V*L*
3 series · 3 of 3 positions shown · non-contrast
Comparison: 02/05/2009

CLINICAL DATA: Left shoulder pain beginning 6 months ago. No known
injury.

EXAM:
LEFT SHOULDER - 2+ VIEW

[w shoulder grashey left]
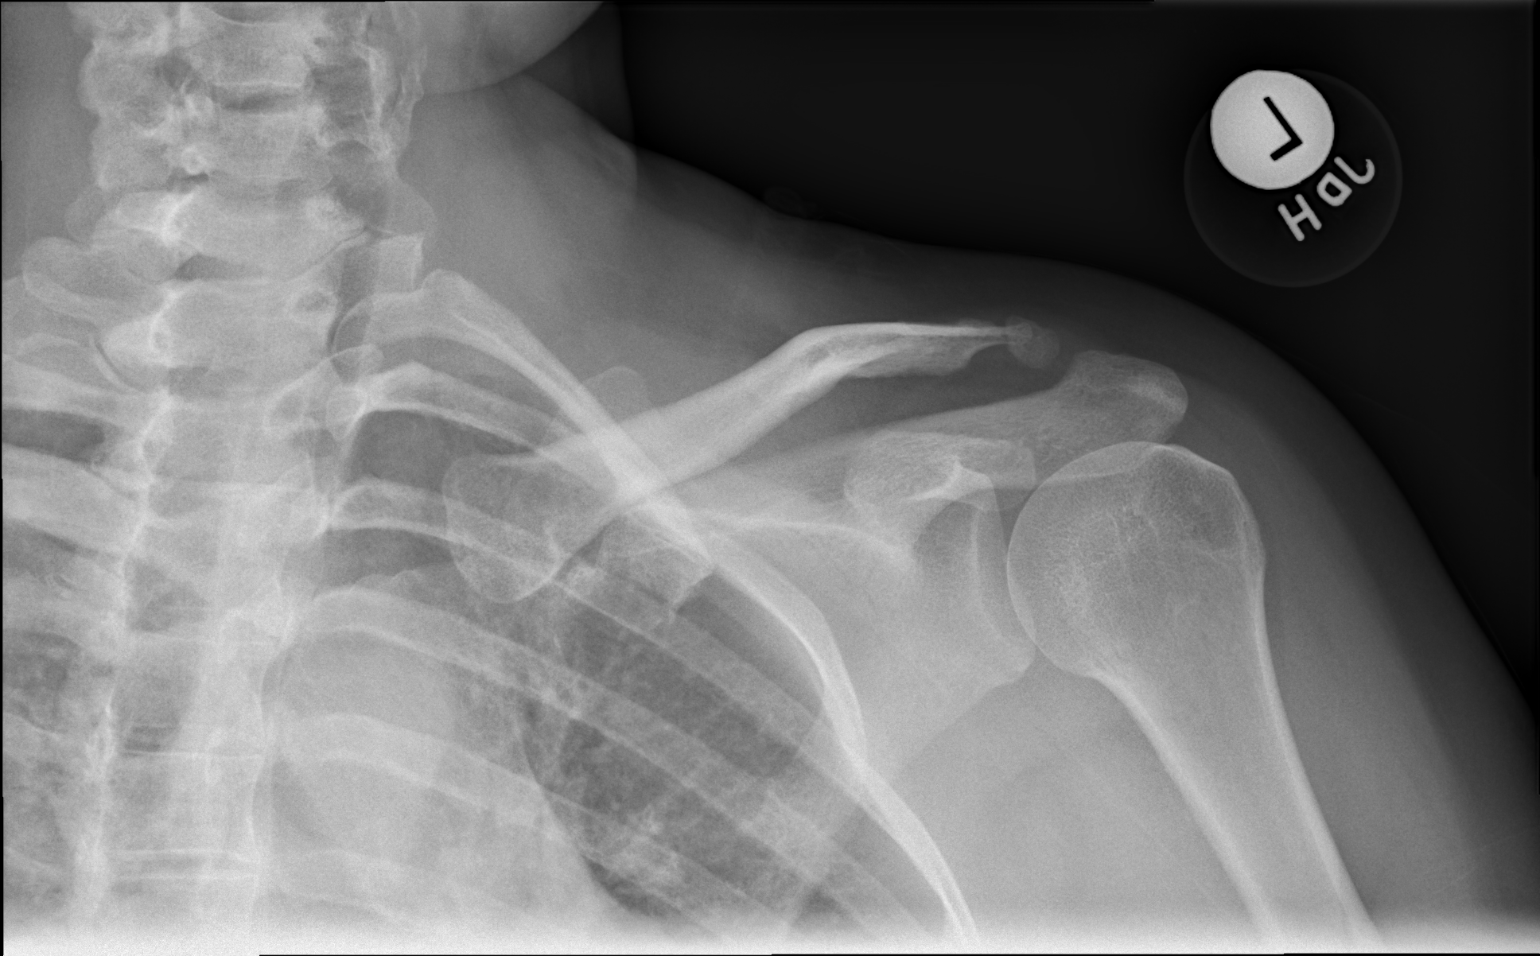

[w shoulder y-view left]
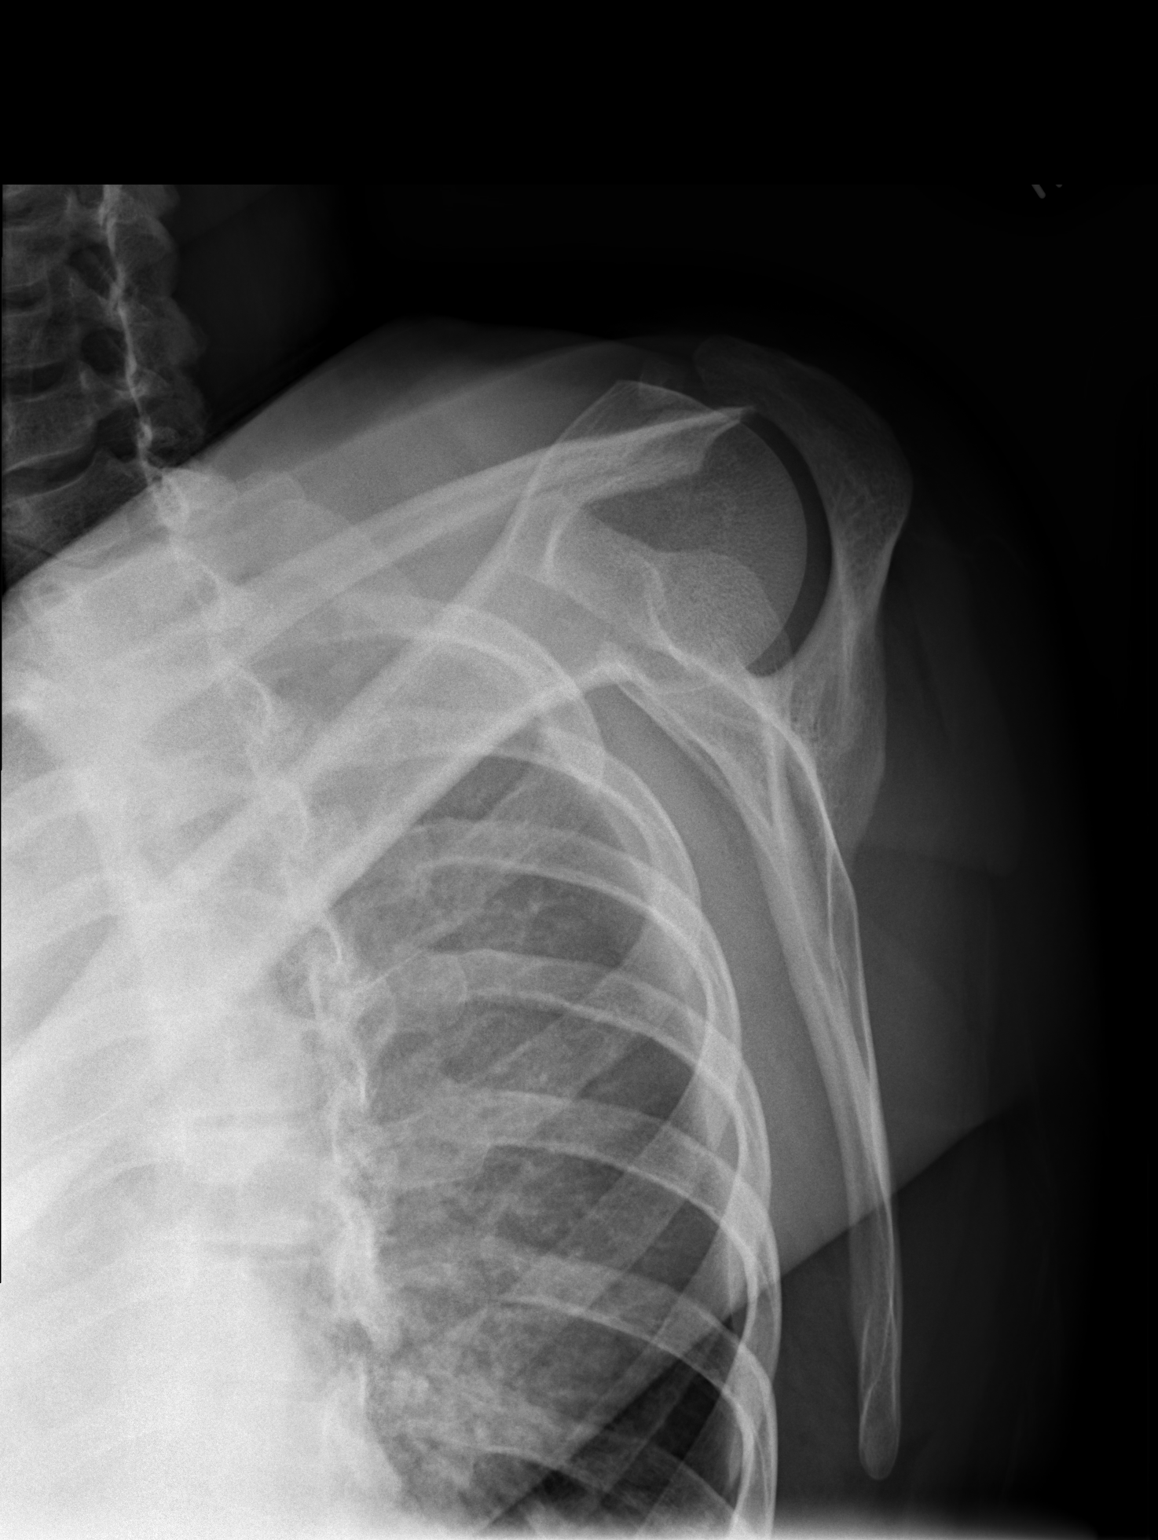

[w shoulder axillary left]
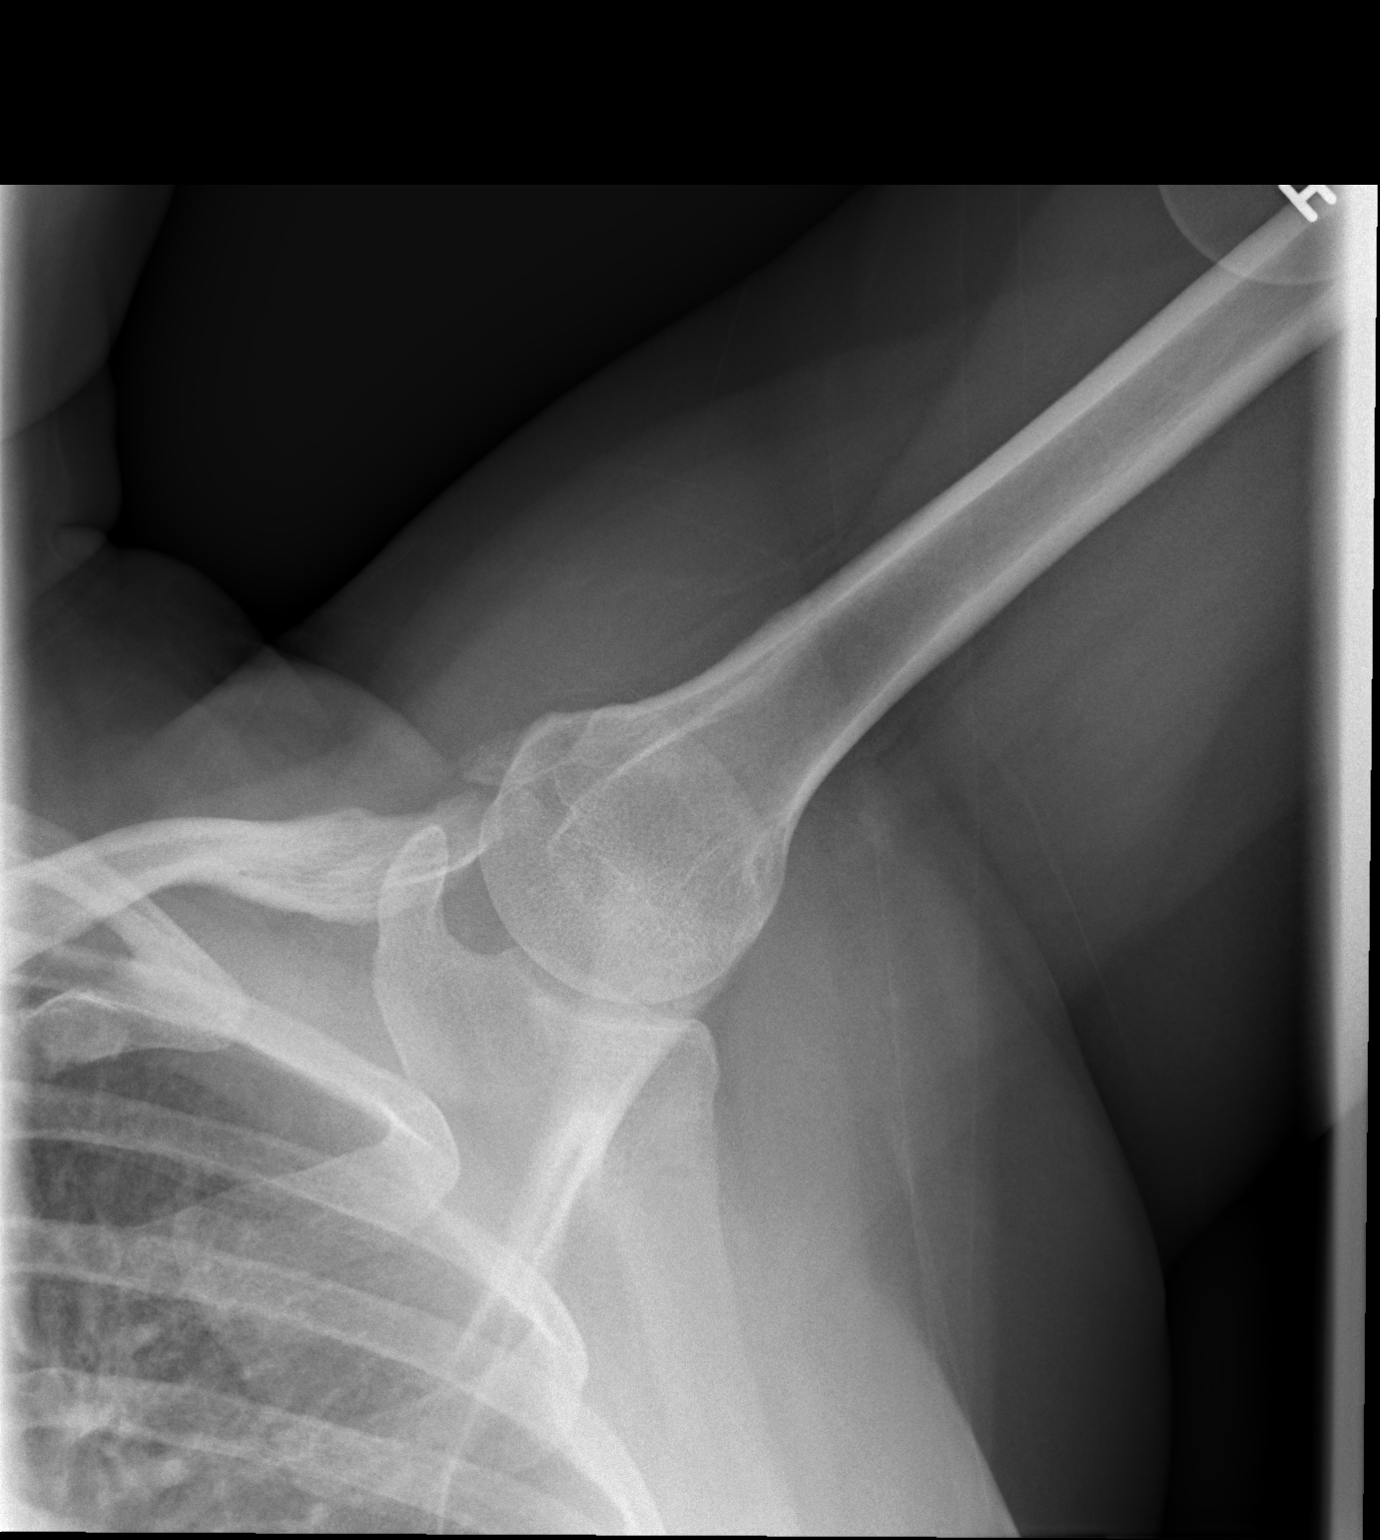

[3 of 3 positions shown; findings below may reference images not displayed]

FINDINGS: Interval resection or resorption of the distal left clavicle. Well
corticated rounded bone density noted in the region of the distal
clavicle. No acute bony abnormality. No acute fracture, subluxation
or dislocation.
IMPRESSION: No acute bony abnormality. Interval resection or resorption of the
distal left clavicle.

## 2016-12-10 ENCOUNTER — Other Ambulatory Visit: Payer: Self-pay | Admitting: Gastroenterology

## 2016-12-10 ENCOUNTER — Ambulatory Visit
Admission: RE | Admit: 2016-12-10 | Discharge: 2016-12-10 | Disposition: A | Discharge status: Home / Self Care | Payer: No Typology Code available for payment source | Source: Ambulatory Visit | Attending: Gastroenterology | Admitting: Gastroenterology

## 2016-12-10 DIAGNOSIS — R140 Abdominal distension (gaseous): Secondary | ICD-10-CM

## 2016-12-10 DIAGNOSIS — R109 Unspecified abdominal pain: Secondary | ICD-10-CM

## 2016-12-17 ENCOUNTER — Other Ambulatory Visit: Payer: Self-pay | Admitting: Gastroenterology

## 2016-12-17 DIAGNOSIS — R109 Unspecified abdominal pain: Secondary | ICD-10-CM

## 2016-12-19 ENCOUNTER — Ambulatory Visit
Admission: RE | Admit: 2016-12-19 | Discharge: 2016-12-19 | Disposition: A | Discharge status: Home / Self Care | Payer: Self-pay | Source: Ambulatory Visit | Attending: Gastroenterology | Admitting: Gastroenterology

## 2016-12-19 DIAGNOSIS — R109 Unspecified abdominal pain: Secondary | ICD-10-CM

## 2016-12-19 MED ORDER — IOPAMIDOL (ISOVUE-300) INJECTION 61%
125.0000 mL | Freq: Once | INTRAVENOUS | Status: AC | PRN
  Administered 2016-12-19: 125 mL via INTRAVENOUS

## 2016-12-23 ENCOUNTER — Other Ambulatory Visit: Payer: Self-pay

## 2017-04-03 ENCOUNTER — Encounter (HOSPITAL_COMMUNITY): Payer: Self-pay | Admitting: Family Medicine

## 2017-04-03 ENCOUNTER — Ambulatory Visit (HOSPITAL_COMMUNITY)
Admission: EM | Admit: 2017-04-03 | Discharge: 2017-04-03 | Disposition: A | Discharge status: Home / Self Care | Payer: No Typology Code available for payment source | Attending: Family Medicine | Admitting: Family Medicine

## 2017-04-03 DIAGNOSIS — M722 Plantar fascial fibromatosis: Secondary | ICD-10-CM

## 2017-04-03 MED ORDER — TRIAMCINOLONE ACETONIDE 40 MG/ML IJ SUSP
INTRAMUSCULAR | Status: AC
  Filled 2017-04-03: qty 1

## 2017-04-03 MED ORDER — BUPIVACAINE HCL (PF) 0.5 % IJ SOLN
INTRAMUSCULAR | Status: AC
  Filled 2017-04-03: qty 10

## 2017-04-03 MED ORDER — PREDNISONE 10 MG (21) PO TBPK: ORAL_TABLET | Freq: Every day | ORAL | 0 refills | Status: DC

## 2017-04-03 NOTE — ED Triage Notes (Signed)
Pt here for let foot pain. sts she has been treating herself at home for plantar fascitis and not better.

## 2017-04-03 NOTE — ED Provider Notes (Signed)
CSN: [id number]     Arrival date & time 04/03/17  1848 History   First MD Initiated Contact with Patient 04/03/17 1951     Chief Complaint  Patient presents with  . Foot Pain   (Consider location/radiation/quality/duration/timing/severity/associated sxs/prior Treatment) 41 year old female presents to clinic for left foot pain ongoing for 1 month. States is been worsening, has not seen medical attention for this before, however believes it is "plantar fasciitis, based on her googled searches. She is tried over-the-counter Tylenol, Motrin, naproxen, orthotics, etc., all with minimal relief. Denies any fever, chills, erythema, or swelling in the foot, pain is worse with walking and bearing weight.   The history is provided by the patient.  Foot Pain     Past Medical History:  Diagnosis Date  . Depression   . Hypertension   . Palpitations   . PTSD (post-traumatic stress disorder)   . Seizures (HCC)    Past Surgical History:  Procedure Laterality Date  . CHOLECYSTECTOMY N/A 07/25/2015   Procedure: LAPAROSCOPIC CHOLECYSTECTOMY WITH INTRAOPERATIVE CHOLANGIOGRAM;  Surgeon: Chevis Pretty III, MD;  Location: [facility];  Service: General;  Laterality: N/A;  . DILATION AND CURETTAGE OF UTERUS    . FACIAL COSMETIC SURGERY    . INDUCED ABORTION     History reviewed. No pertinent family history. Social History  Substance Use Topics  . Smoking status: Former Smoker    Packs/day: 0.50    Types: E-cigarettes  . Smokeless tobacco: Never Used  . Alcohol use Yes     Comment: Rarely - Had 3 beers tonight   OB History    No data available     Review of Systems  Constitutional: Negative.   HENT: Negative.   Respiratory: Negative.   Cardiovascular: Negative.   Musculoskeletal:       Left foot pain  Neurological: Negative.     Allergies  Dilantin [phenytoin sodium extended] and Imitrex [sumatriptan]  Home Medications   Prior to Admission medications   Medication Sig Start Date End Date  Taking? Authorizing Provider  Pancrelipase, Lip-Prot-Amyl, (ZENPEP) 40000-136000 units CPEP Take by mouth.   Yes [provider]  busPIRone (BUSPAR) 30 MG tablet Take 30 mg by mouth 2 (two) times daily.    [provider]  HYDROcodone-acetaminophen (NORCO) 5-325 MG tablet Take 1-2 tablets by mouth every 6 (six) hours as needed. 08/07/16   Geoffery Lyons, MD  metoprolol tartrate (LOPRESSOR) 25 MG tablet Take 25 mg by mouth 2 (two) times daily. 07/10/15   [provider]  oxyCODONE-acetaminophen (PERCOCET) 5-325 MG tablet Take 1-2 tablets by mouth every 6 (six) hours as needed. Patient taking differently: Take 1-2 tablets by mouth every 6 (six) hours as needed for moderate pain or severe pain.  07/20/15   Geoffery Lyons, MD  oxyCODONE-acetaminophen (PERCOCET) 5-325 MG tablet Take 1-2 tablets by mouth every 6 (six) hours as needed for moderate pain or severe pain. 07/26/15   Jerald Kief, MD  PHENobarbital (LUMINAL) 97.2 MG tablet Take 97.2 mg by mouth daily. 07/08/15   [provider]  predniSONE (STERAPRED UNI-PAK 21 TAB) 10 MG (21) TBPK tablet Take by mouth daily. Take 6 tabs by mouth daily  for 2 days, then 5 tabs for 2 days, then 4 tabs for 2 days, then 3 tabs for 2 days, 2 tabs for 2 days, then 1 tab by mouth daily for 2 days 04/03/17   Dorena Bodo, NP  Probiotic Product (PROBIOTIC PO) Take 1 tablet by mouth daily.  [provider]  sertraline (ZOLOFT) 100 MG tablet Take 150 mg by mouth daily.    [provider]  TURMERIC PO Take 400 mg by mouth daily.    [provider]   Meds Ordered and Administered this Visit  Medications - No data to display  BP 126/84   Pulse 82   Temp 98.3 F (36.8 C)   Resp 16   SpO2 100%  No data found.   Physical Exam  Constitutional: She is oriented to person, place, and time. She appears well-developed and well-nourished. No distress.  HENT:  Head: Normocephalic and atraumatic.  Right  Ear: External ear normal.  Left Ear: External ear normal.  Neck: Normal range of motion.  Cardiovascular: Normal rate and regular rhythm.   Pulmonary/Chest: Effort normal and breath sounds normal.  Musculoskeletal:       Left foot: There is tenderness.       Feet:  Neurological: She is alert and oriented to person, place, and time.  Skin: Skin is warm and dry. Capillary refill takes less than 2 seconds. No rash noted. She is not diaphoretic. No erythema.  Psychiatric: She has a normal mood and affect. Her behavior is normal.  Nursing note and vitals reviewed.   Urgent Care Course     .Joint Aspiration/Arthrocentesis Date/Time: 04/03/2017 8:29 PM Performed by: Dorena BodoKENNARD, Amier Hoyt Authorized by: Mardella LaymanHAGLER, BRIAN   Consent:    Consent obtained:  Verbal   Consent given by:  Patient   Risks discussed:  Bleeding, infection, nerve damage and pain   Alternatives discussed:  Alternative treatment Location:    Location: plantar bursa. Anesthesia (see MAR for exact dosages):    Anesthesia method:  Local infiltration   Local anesthetic:  Lidocaine 2% w/o epi and bupivacaine 0.5% w/o epi Procedure details:    Needle gauge: 27.   Ultrasound guidance: no     Approach:  Inferior   Aspirate amount:  0   Steroid injected: yes     Specimen collected: no   Post-procedure details:    Dressing:  Adhesive bandage   Patient tolerance of procedure:  Tolerated well, no immediate complications Comments:     Steroid injection for plantar Fasciitis at the proximal insertion of the plantar fascia    (including critical care time)  Labs Review Labs Reviewed - No data to display  Imaging Review No results found.    MDM   1. Plantar fasciitis of left foot    Injection done for plantar fasciitis, contact information given for podiatry, follow-up as needed.    Dorena BodoKennard, Henya Aguallo, NP 04/03/17 2034

## 2017-04-03 NOTE — Discharge Instructions (Signed)
You've received a steroid injection for plantar fasciitis. Recommend rest, ice, elevation when necessary. Use of orthotics, and I have included the contact information for Dr. Logan BoresEvans, who is a podiatrist. Contact his office as needed if symptoms worsen, or fail to resolve. I've also included a prescription for prednisone taper, hold onto this prescription, if you have no relief from your injection tonight in 3-5 days, then have it filled and take as instructed.

## 2017-07-07 ENCOUNTER — Ambulatory Visit
Admission: RE | Admit: 2017-07-07 | Discharge: 2017-07-07 | Disposition: A | Discharge status: Home / Self Care | Payer: No Typology Code available for payment source | Source: Ambulatory Visit | Attending: Physician Assistant | Admitting: Physician Assistant

## 2017-07-07 ENCOUNTER — Other Ambulatory Visit: Payer: Self-pay | Admitting: Physician Assistant

## 2017-07-07 DIAGNOSIS — R1084 Generalized abdominal pain: Secondary | ICD-10-CM

## 2017-07-11 ENCOUNTER — Emergency Department (HOSPITAL_BASED_OUTPATIENT_CLINIC_OR_DEPARTMENT_OTHER)
Admission: EM | Admit: 2017-07-11 | Discharge: 2017-07-11 | Disposition: A | Discharge status: Home / Self Care | Payer: Self-pay | Attending: Emergency Medicine | Admitting: Emergency Medicine

## 2017-07-11 ENCOUNTER — Encounter (HOSPITAL_BASED_OUTPATIENT_CLINIC_OR_DEPARTMENT_OTHER): Payer: Self-pay | Admitting: *Deleted

## 2017-07-11 ENCOUNTER — Emergency Department (HOSPITAL_COMMUNITY)
Admission: EM | Admit: 2017-07-11 | Discharge: 2017-07-11 | Disposition: A | Discharge status: Home / Self Care | Payer: Self-pay | Attending: Emergency Medicine | Admitting: Emergency Medicine

## 2017-07-11 ENCOUNTER — Emergency Department (HOSPITAL_BASED_OUTPATIENT_CLINIC_OR_DEPARTMENT_OTHER): Payer: Self-pay

## 2017-07-11 DIAGNOSIS — Z5321 Procedure and treatment not carried out due to patient leaving prior to being seen by health care provider: Secondary | ICD-10-CM | POA: Insufficient documentation

## 2017-07-11 DIAGNOSIS — R109 Unspecified abdominal pain: Secondary | ICD-10-CM | POA: Insufficient documentation

## 2017-07-11 DIAGNOSIS — Z79899 Other long term (current) drug therapy: Secondary | ICD-10-CM | POA: Insufficient documentation

## 2017-07-11 DIAGNOSIS — R1084 Generalized abdominal pain: Secondary | ICD-10-CM | POA: Insufficient documentation

## 2017-07-11 DIAGNOSIS — I10 Essential (primary) hypertension: Secondary | ICD-10-CM | POA: Insufficient documentation

## 2017-07-11 DIAGNOSIS — Z87891 Personal history of nicotine dependence: Secondary | ICD-10-CM | POA: Insufficient documentation

## 2017-07-11 LAB — URINALYSIS, MICROSCOPIC (REFLEX)

## 2017-07-11 LAB — URINALYSIS, ROUTINE W REFLEX MICROSCOPIC
Bilirubin Urine: NEGATIVE
Glucose, UA: NEGATIVE mg/dL
Ketones, ur: NEGATIVE mg/dL
Leukocytes, UA: NEGATIVE
Nitrite: NEGATIVE
Protein, ur: NEGATIVE mg/dL
Specific Gravity, Urine: >1.03 — ABNORMAL HIGH (ref 1.005–1.030)
pH: 6 (ref 5.0–8.0)

## 2017-07-11 LAB — CBC WITH DIFFERENTIAL/PLATELET
Basophils Absolute: 0 10*3/uL (ref 0.0–0.1)
Basophils Relative: 0 %
Eosinophils Absolute: 0.2 10*3/uL (ref 0.0–0.7)
Eosinophils Relative: 2 %
HCT: 41.3 % (ref 36.0–46.0)
Hemoglobin: 14.2 g/dL (ref 12.0–15.0)
Lymphocytes Relative: 21 %
Lymphs Abs: 2.1 10*3/uL (ref 0.7–4.0)
MCH: 30.7 pg (ref 26.0–34.0)
MCHC: 34.4 g/dL (ref 30.0–36.0)
MCV: 89.4 fL (ref 78.0–100.0)
Monocytes Absolute: 0.7 10*3/uL (ref 0.1–1.0)
Monocytes Relative: 7 %
Neutro Abs: 7 10*3/uL (ref 1.7–7.7)
Neutrophils Relative %: 70 %
Platelets: 384 10*3/uL (ref 150–400)
RBC: 4.62 MIL/uL (ref 3.87–5.11)
RDW: 13.4 % (ref 11.5–15.5)
WBC: 10 10*3/uL (ref 4.0–10.5)

## 2017-07-11 LAB — COMPREHENSIVE METABOLIC PANEL
ALT: 45 U/L (ref 14–54)
AST: 37 U/L (ref 15–41)
Albumin: 4.1 g/dL (ref 3.5–5.0)
Alkaline Phosphatase: 56 U/L (ref 38–126)
Anion gap: 10 (ref 5–15)
BUN: 12 mg/dL (ref 6–20)
CO2: 23 mmol/L (ref 22–32)
Calcium: 9.2 mg/dL (ref 8.9–10.3)
Chloride: 102 mmol/L (ref 101–111)
Creatinine, Ser: 0.51 mg/dL (ref 0.44–1.00)
GFR calc Af Amer: >60 mL/min (ref >60)
GFR calc non Af Amer: >60 mL/min (ref >60)
Glucose, Bld: 93 mg/dL (ref 65–99)
Potassium: 3.9 mmol/L (ref 3.5–5.1)
Sodium: 135 mmol/L (ref 135–145)
Total Bilirubin: 0.3 mg/dL (ref 0.3–1.2)
Total Protein: 8.1 g/dL (ref 6.5–8.1)

## 2017-07-11 LAB — LIPASE, BLOOD: Lipase: 23 U/L (ref 11–51)

## 2017-07-11 LAB — PREGNANCY, URINE: Preg Test, Ur: NEGATIVE

## 2017-07-11 MED ORDER — ONDANSETRON HCL 4 MG/2ML IJ SOLN
4.0000 mg | Freq: Once | INTRAMUSCULAR | Status: AC
  Administered 2017-07-11: 4 mg via INTRAVENOUS
  Filled 2017-07-11: qty 2

## 2017-07-11 MED ORDER — SODIUM CHLORIDE 0.9 % IV BOLUS (SEPSIS)
1000.0000 mL | Freq: Once | INTRAVENOUS | Status: AC
  Administered 2017-07-11: 1000 mL via INTRAVENOUS

## 2017-07-11 MED ORDER — DICYCLOMINE HCL 20 MG PO TABS: 20.0000 mg | ORAL_TABLET | Freq: Two times a day (BID) | ORAL | 0 refills | Status: AC | PRN

## 2017-07-11 MED ORDER — IOPAMIDOL (ISOVUE-300) INJECTION 61%
100.0000 mL | Freq: Once | INTRAVENOUS | Status: AC | PRN
  Administered 2017-07-11: 100 mL via INTRAVENOUS

## 2017-07-11 MED ORDER — ONDANSETRON 4 MG PO TBDP: 4.0000 mg | ORAL_TABLET | Freq: Three times a day (TID) | ORAL | 0 refills | Status: AC | PRN

## 2017-07-11 MED ORDER — MORPHINE SULFATE (PF) 4 MG/ML IV SOLN
8.0000 mg | Freq: Once | INTRAVENOUS | Status: AC
  Administered 2017-07-11: 8 mg via INTRAVENOUS
  Filled 2017-07-11: qty 2

## 2017-07-11 NOTE — ED Triage Notes (Signed)
Pt c/o abd pain and diarrhea x 4 days Seen by GI 07/07/17

## 2017-07-11 NOTE — ED Provider Notes (Signed)
[facility] Provider Note   CSN: [id number] Arrival date & time: 07/11/17  1731     History   Chief Complaint Chief Complaint  Patient presents with  . Abdominal Pain    HPI Brooke Avila is a 41 y.o. female.  HPI  41 year old female with a history of chronic abdominal pain and chronic malabsorption presents with abdominal pain and vomiting. She states she was sent in by her gastroenterologist for a CT scan to rule out colitis or diverticulitis. Note also asks for stool studies. Patient states she daily has abdominal pain that starts in the epigastrium and goes throughout her entire abdomen. She has chronic abdominal swelling but states it seems to be worse recently. Today he has been expressing vomiting. Unable to keep down food but has cut down fluids. She has chronic diarrhea over the last 2 weeks the diarrhea is now black. Saw her gastroenterologist office on 10/8 were she had blood work. Because of the vomiting and worsening pain, her gastrologist recommended she come get a CT scan. She states the pain she is expressing is much worse than typical but otherwise similar in the type of pain. No urinary symptoms. No new back pain. No fevers but has felt warm all over, especially her abdomen.  Past Medical History:  Diagnosis Date  . Depression   . Hypertension   . Palpitations   . PTSD (post-traumatic stress disorder)   . Seizures [facility])     Patient Active Problem List   Diagnosis Date Noted  . Chronic cholecystitis with calculus 08/01/2015  . Elevated liver function tests   . Essential hypertension   . Gallstone   . Cholelithiasis 07/23/2015  . UTI (lower urinary tract infection) 07/23/2015  . Abdominal pain 07/23/2015  . Finger pain, left 07/23/2015  . Hypertension   . Seizures (HCC)   . Depression     Past Surgical History:  Procedure Laterality Date  . CHOLECYSTECTOMY N/A 07/25/2015   Procedure: LAPAROSCOPIC CHOLECYSTECTOMY WITH INTRAOPERATIVE  CHOLANGIOGRAM;  Surgeon: Chevis Pretty III, MD;  Location: [facility]S;  Service: General;  Laterality: N/A;  . DILATION AND CURETTAGE OF UTERUS    . FACIAL COSMETIC SURGERY    . INDUCED ABORTION      OB History    No data available       Home Medications    Prior to Admission medications   Medication Sig Start Date End Date Taking? Authorizing Provider  busPIRone (BUSPAR) 30 MG tablet Take 30 mg by mouth 2 (two) times daily.    [provider]  dicyclomine (BENTYL) 20 MG tablet Take 1 tablet (20 mg total) by mouth 2 (two) times daily as needed (abdominal pain/spasms). 07/11/17   Pricilla Loveless, MD  HYDROcodone-acetaminophen (NORCO) 5-325 MG tablet Take 1-2 tablets by mouth every 6 (six) hours as needed. 08/07/16   Geoffery Lyons, MD  metoprolol tartrate (LOPRESSOR) 25 MG tablet Take 25 mg by mouth 2 (two) times daily. 07/10/15   [provider]  ondansetron (ZOFRAN ODT) 4 MG disintegrating tablet Take 1 tablet (4 mg total) by mouth every 8 (eight) hours as needed for nausea or vomiting. 07/11/17   Pricilla Loveless, MD  oxyCODONE-acetaminophen (PERCOCET) 5-325 MG tablet Take 1-2 tablets by mouth every 6 (six) hours as needed. Patient taking differently: Take 1-2 tablets by mouth every 6 (six) hours as needed for moderate pain or severe pain.  07/20/15   Geoffery Lyons, MD  oxyCODONE-acetaminophen (PERCOCET) 5-325 MG tablet Take 1-2 tablets by  mouth every 6 (six) hours as needed for moderate pain or severe pain. 07/26/15   Jerald Kief, MD  Pancrelipase, Lip-Prot-Amyl, (ZENPEP) 82956-213086 units CPEP Take by mouth.    [provider]  PHENobarbital (LUMINAL) 97.2 MG tablet Take 97.2 mg by mouth daily. 07/08/15   [provider]  sertraline (ZOLOFT) 100 MG tablet Take 150 mg by mouth daily.    [provider]    Family History History reviewed. No pertinent family history.  Social History Social History  Substance Use Topics  . Smoking status:  Former Smoker    Packs/day: 0.50    Types: E-cigarettes  . Smokeless tobacco: Never Used  . Alcohol use Yes     Comment: Rarely - Had 3 beers tonight     Allergies   Dilantin [phenytoin sodium extended]; Imitrex [sumatriptan]; and Minipress [prazosin hcl]   Review of Systems Review of Systems  Constitutional: Positive for fever (has felt warm all over). Negative for chills.  Gastrointestinal: Positive for abdominal distention, abdominal pain, diarrhea, nausea and vomiting.  Genitourinary: Negative for dysuria.  Musculoskeletal: Positive for back pain (chronic, unchanged).  All other systems reviewed and are negative.    Physical Exam Updated Vital Signs BP 90/77 (BP Location: Left Arm)   Pulse 93   Temp 99.5 F (37.5 C) (Oral)   Resp 18   Ht  (1.626 m)   Wt 94.3 kg (208 lb)   LMP 07/10/2017   SpO2 96%   BMI 35.70 kg/m   Physical Exam  Constitutional: She is oriented to person, place, and time. She appears well-developed and well-nourished. No distress.  obese  HENT:  Head: Normocephalic and atraumatic.  Right Ear: External ear normal.  Left Ear: External ear normal.  Nose: Nose normal.  Eyes: Right eye exhibits no discharge. Left eye exhibits no discharge.  Cardiovascular: Normal rate, regular rhythm and normal heart sounds.   Pulmonary/Chest: Effort normal and breath sounds normal.  Abdominal: Soft. She exhibits no mass. There is tenderness. There is no rebound and no guarding.  Diffuse tenderness, most prominent in epigastrum  Neurological: She is alert and oriented to person, place, and time.  Skin: Skin is warm and dry. She is not diaphoretic.  Nursing note and vitals reviewed.    ED Treatments / Results  Labs (all labs ordered are listed, but only abnormal results are displayed) Labs Reviewed  URINALYSIS, ROUTINE W REFLEX MICROSCOPIC - Abnormal; Notable for the following:       Result Value   Specific Gravity, Urine >1.030 (*)    Hgb urine  dipstick LARGE (*)    All other components within normal limits  URINALYSIS, MICROSCOPIC (REFLEX) - Abnormal; Notable for the following:    Bacteria, UA FEW (*)    Squamous Epithelial / LPF 0-5 (*)    All other components within normal limits  GASTROINTESTINAL PANEL BY PCR, STOOL (REPLACES STOOL CULTURE)  C DIFFICILE QUICK SCREEN W PCR REFLEX  COMPREHENSIVE METABOLIC PANEL  LIPASE, BLOOD  CBC WITH DIFFERENTIAL/PLATELET  PREGNANCY, URINE    EKG  EKG Interpretation None       Radiology Ct Abdomen Pelvis W Contrast  Result Date: 07/11/2017 CLINICAL DATA:  Upper abdominal pain, bloating and diarrhea for 4 days. EXAM: CT ABDOMEN AND PELVIS WITH CONTRAST TECHNIQUE: Multidetector CT imaging of the abdomen and pelvis was performed using the standard protocol following bolus administration of intravenous contrast. CONTRAST:  ISOVUE-300 IOPAMIDOL (ISOVUE-300) INJECTION 61% COMPARISON:  Abdominal radiograph 07/07/2017 FINDINGS:  Lower chest: No acute abnormality. Hepatobiliary: No focal liver abnormality is seen. Status post cholecystectomy. No biliary dilatation. Pancreas: Unremarkable. No pancreatic ductal dilatation or surrounding inflammatory changes. Spleen: Normal in size without focal abnormality. Adrenals/Urinary Tract: Adrenal glands are unremarkable. Kidneys are normal, without renal calculi, focal lesion, or hydronephrosis. Bladder is unremarkable. Stomach/Bowel: Stomach is within normal limits. Appendix appears normal. No evidence of bowel wall thickening, distention, or inflammatory changes. Vascular/Lymphatic: No significant vascular findings are present. No enlarged abdominal or pelvic lymph nodes. Shotty mesenteric and retroperitoneal, sub pathologic by CT criteria lymph nodes. Reproductive: Uterus and bilateral adnexa are unremarkable. Other: No abdominal wall hernia or abnormality. No abdominopelvic ascites. Musculoskeletal: No acute or significant osseous findings. IMPRESSION:  No acute or significant abnormalities within the abdomen or pelvis. Electronically Signed   By: Ted Mcalpine M.D.   On: 07/11/2017 20:18    Procedures Procedures (including critical care time)  Medications Ordered in ED Medications  ondansetron (ZOFRAN) injection 4 mg (4 mg Intravenous Given 07/11/17 1920)  morphine 4 MG/ML injection 8 mg (8 mg Intravenous Given 07/11/17 1919)  sodium chloride 0.9 % bolus 1,000 mL (0 mLs Intravenous Stopped 07/11/17 2022)  iopamidol (ISOVUE-300) 61 % injection 100 mL (100 mLs Intravenous Contrast Given 07/11/17 1951)     Initial Impression / Assessment and Plan / ED Course  I have reviewed the triage vital signs and the nursing notes.  Pertinent labs & imaging results that were available during my care of the patient were reviewed by me and considered in my medical decision making (see chart for details).     No clear cause of the patient's abdominal symptoms. Her workup is unremarkable including normal WBC, normal renal function, normal LFTs. Lipase is normal. Her urinalysis is unremarkable besides blood in her urine. CT scan shows no acute pathology. This could be multiple different things including an exacerbation of her chronic abdominal pain versus gastritis on top of her abdominal pathology. We'll give her Zofran and she also has Phenergan at home. Bentyl for pain. I do not think this warrants narcotics which she is currently asking for. Follow-up with her gastroenterologist. Her C. difficile and GI stool panel are currently both pending. Follow-up with gastroenterology for results.  Final Clinical Impressions(s) / ED Diagnoses   Final diagnoses:  Generalized abdominal pain    New Prescriptions Discharge Medication List as of 07/11/2017  9:12 PM    START taking these medications   Details  dicyclomine (BENTYL) 20 MG tablet Take 1 tablet (20 mg total) by mouth 2 (two) times daily as needed (abdominal pain/spasms)., Starting Fri  07/11/2017, Print    ondansetron (ZOFRAN ODT) 4 MG disintegrating tablet Take 1 tablet (4 mg total) by mouth every 8 (eight) hours as needed for nausea or vomiting., Starting Fri 07/11/2017, Print         Pricilla Loveless, MD 07/11/17 2222

## 2017-07-11 NOTE — ED Notes (Signed)
Waiting on Urine Preg test before proceeding with CT Abd/Pelvis

## 2017-07-12 LAB — GASTROINTESTINAL PANEL BY PCR, STOOL (REPLACES STOOL CULTURE)

## 2017-07-12 LAB — C DIFFICILE QUICK SCREEN W PCR REFLEX
C DIFFICILE (CDIFF) INTERP: NOT DETECTED
C DIFFICILE (CDIFF) TOXIN: NEGATIVE
C DIFFICLE (CDIFF) ANTIGEN: NEGATIVE

## 2018-05-25 ENCOUNTER — Other Ambulatory Visit (HOSPITAL_COMMUNITY)
Admission: RE | Admit: 2018-05-25 | Discharge: 2018-05-25 | Disposition: A | Discharge status: Home / Self Care | Payer: Self-pay | Source: Ambulatory Visit | Attending: Family Medicine | Admitting: Family Medicine

## 2018-05-25 ENCOUNTER — Other Ambulatory Visit: Payer: Self-pay | Admitting: Family Medicine

## 2018-05-25 DIAGNOSIS — Z124 Encounter for screening for malignant neoplasm of cervix: Secondary | ICD-10-CM | POA: Insufficient documentation

## 2018-05-27 LAB — CYTOLOGY - PAP
Diagnosis: NEGATIVE
HPV: NOT DETECTED

## 2018-08-20 ENCOUNTER — Ambulatory Visit: Payer: Self-pay | Admitting: Psychiatry

## 2018-10-11 ENCOUNTER — Other Ambulatory Visit: Payer: Self-pay | Admitting: Psychiatry

## 2018-10-12 NOTE — Telephone Encounter (Signed)
Need to review paper chart  

## 2018-10-20 ENCOUNTER — Ambulatory Visit: Payer: Self-pay | Admitting: Psychiatry

## 2018-10-20 DIAGNOSIS — F329 Major depressive disorder, single episode, unspecified: Secondary | ICD-10-CM

## 2018-10-20 DIAGNOSIS — F32A Depression, unspecified: Secondary | ICD-10-CM

## 2018-10-20 DIAGNOSIS — F4310 Post-traumatic stress disorder, unspecified: Secondary | ICD-10-CM

## 2018-10-20 DIAGNOSIS — F411 Generalized anxiety disorder: Secondary | ICD-10-CM

## 2018-10-20 MED ORDER — SERTRALINE HCL 100 MG PO TABS: 150.0000 mg | ORAL_TABLET | Freq: Every day | ORAL | 11 refills | Status: DC

## 2018-10-20 MED ORDER — BUSPIRONE HCL 30 MG PO TABS: 30.0000 mg | ORAL_TABLET | Freq: Two times a day (BID) | ORAL | 11 refills | Status: AC

## 2018-10-20 NOTE — Progress Notes (Signed)
[facility]  Patient ID: Brooke Avila,  MRN: [id number]  PCP: Sigmund Hazel, MD  Date of Evaluation: 10/20/2018 Time spent:20 minutes  Chief Complaint:   HISTORY/CURRENT STATUS: HPI patient last seen 1 year ago.  Doing fine at that visit.  Diagnoses include depression, anxiety with panic attacks, PTSD. Continues to do well.  GI doctor concerned about Zoloft causing diarrhea.    Individual Medical History/ Review of Systems: Changes? :No   Allergies: Dilantin [phenytoin sodium extended]; Imitrex [sumatriptan]; and Minipress [prazosin hcl]  Current Medications:  Current Outpatient Medications:  .  busPIRone (BUSPAR) 30 MG tablet, Take 30 mg by mouth 2 (two) times daily., Disp: , Rfl:  .  sertraline (ZOLOFT) 100 MG tablet, Take 150 mg by mouth daily., Disp: , Rfl:  .  dicyclomine (BENTYL) 20 MG tablet, Take 1 tablet (20 mg total) by mouth 2 (two) times daily as needed (abdominal pain/spasms)., Disp: 20 tablet, Rfl: 0 .  HYDROcodone-acetaminophen (NORCO) 5-325 MG tablet, Take 1-2 tablets by mouth every 6 (six) hours as needed., Disp: 15 tablet, Rfl: 0 .  metoprolol tartrate (LOPRESSOR) 25 MG tablet, Take 25 mg by mouth 2 (two) times daily., Disp: , Rfl: 0 .  ondansetron (ZOFRAN ODT) 4 MG disintegrating tablet, Take 1 tablet (4 mg total) by mouth every 8 (eight) hours as needed for nausea or vomiting., Disp: 10 tablet, Rfl: 0 .  oxyCODONE-acetaminophen (PERCOCET) 5-325 MG tablet, Take 1-2 tablets by mouth every 6 (six) hours as needed. (Patient taking differently: Take 1-2 tablets by mouth every 6 (six) hours as needed for moderate pain or severe pain. ), Disp: 20 tablet, Rfl: 0 .  oxyCODONE-acetaminophen (PERCOCET) 5-325 MG tablet, Take 1-2 tablets by mouth every 6 (six) hours as needed for moderate pain or severe pain., Disp: 10 tablet, Rfl: 0 .  Pancrelipase, Lip-Prot-Amyl, (ZENPEP) 40000-136000 units CPEP, Take by mouth., Disp: , Rfl:  .  PHENobarbital (LUMINAL) 97.2 MG  tablet, Take 97.2 mg by mouth daily., Disp: , Rfl: 1 Medication Side Effects: diarrhea  Family Medical/ Social History: Changes? No  MENTAL HEALTH EXAM:  There were no vitals taken for this visit.There is no height or weight on file to calculate BMI.  General Appearance: Casual  Eye Contact:  Good  Speech:  Clear and Coherent  Volume:  Normal  Mood:  Euthymic  Affect:  Appropriate  Thought Process:  Linear  Orientation:  Full (Time, Place, and Person)  Thought Content: Logical   Suicidal Thoughts:  No  Homicidal Thoughts:  No  Memory:  WNL  Judgement:  Good  Insight:  Good  Psychomotor Activity:  Normal  Concentration:  Concentration: Good  Recall:  Good  Fund of Knowledge: Good  Language: Good  Assets:  Desire for Improvement  ADL's:  Intact  Cognition: WNL  Prognosis:  Good    DIAGNOSES:    ICD-10-CM   1. Depression, unspecified depression type F32.9   2. PTSD (post-traumatic stress disorder) F43.10   3. Anxiety state F41.1     Receiving Psychotherapy: No    RECOMMENDATIONS: We discussed different antidepressants for the patient as GI doctor wants her off Zoloft.  Most of the other SSRIs would cause the same problems.  We could try Trintellix or Viibryd but she has no insurance.  So we will continue Zoloft 150 a day and BuSpar 30 twice daily. Patient to consider snap sleep study as she snores and is tired during the day.  Patient to consider and let me know.  Return in 1 year.   Anne Fu, PA-C

## 2019-10-25 ENCOUNTER — Other Ambulatory Visit: Payer: Self-pay

## 2019-10-25 MED ORDER — SERTRALINE HCL 100 MG PO TABS: 150.0000 mg | ORAL_TABLET | Freq: Every day | ORAL | 0 refills | Status: AC

## 2019-10-27 ENCOUNTER — Ambulatory Visit: Payer: Self-pay | Admitting: Adult Health

## 2021-02-13 ENCOUNTER — Ambulatory Visit: Payer: 59 | Admitting: Behavioral Health

## 2021-02-22 ENCOUNTER — Ambulatory Visit: Payer: 59 | Admitting: Behavioral Health
